# Patient Record
Sex: Female | Born: 1975 | Race: Black or African American | Hispanic: No | Marital: Single | Smoking: Current some day smoker
Health system: Southern US, Community
[De-identification: ages and names within clinical notes are randomized; demographics above are authoritative.]

## PROBLEM LIST (undated history)

## (undated) ENCOUNTER — Emergency Department (HOSPITAL_BASED_OUTPATIENT_CLINIC_OR_DEPARTMENT_OTHER): Admission: EM | Payer: BLUE CROSS/BLUE SHIELD | Source: Home / Self Care

## (undated) DIAGNOSIS — E119 Type 2 diabetes mellitus without complications: Secondary | ICD-10-CM

## (undated) DIAGNOSIS — E7800 Pure hypercholesterolemia, unspecified: Secondary | ICD-10-CM

## (undated) HISTORY — PX: EYE SURGERY: SHX253

## (undated) HISTORY — PX: ECTOPIC PREGNANCY SURGERY: SHX613

## (undated) HISTORY — PX: TUBAL LIGATION: SHX77

---

## 2013-01-06 ENCOUNTER — Emergency Department (HOSPITAL_BASED_OUTPATIENT_CLINIC_OR_DEPARTMENT_OTHER)
Admission: EM | Admit: 2013-01-06 | Discharge: 2013-01-06 | Disposition: A | Discharge status: Home / Self Care | Payer: Medicaid Other | Attending: Emergency Medicine | Admitting: Emergency Medicine

## 2013-01-06 ENCOUNTER — Encounter (HOSPITAL_BASED_OUTPATIENT_CLINIC_OR_DEPARTMENT_OTHER): Payer: Self-pay | Admitting: *Deleted

## 2013-01-06 DIAGNOSIS — R5383 Other fatigue: Secondary | ICD-10-CM | POA: Insufficient documentation

## 2013-01-06 DIAGNOSIS — R42 Dizziness and giddiness: Secondary | ICD-10-CM

## 2013-01-06 DIAGNOSIS — Z3202 Encounter for pregnancy test, result negative: Secondary | ICD-10-CM | POA: Insufficient documentation

## 2013-01-06 DIAGNOSIS — Z79899 Other long term (current) drug therapy: Secondary | ICD-10-CM | POA: Insufficient documentation

## 2013-01-06 DIAGNOSIS — R5381 Other malaise: Secondary | ICD-10-CM | POA: Insufficient documentation

## 2013-01-06 DIAGNOSIS — Z794 Long term (current) use of insulin: Secondary | ICD-10-CM | POA: Insufficient documentation

## 2013-01-06 DIAGNOSIS — E119 Type 2 diabetes mellitus without complications: Secondary | ICD-10-CM | POA: Insufficient documentation

## 2013-01-06 DIAGNOSIS — R112 Nausea with vomiting, unspecified: Secondary | ICD-10-CM | POA: Insufficient documentation

## 2013-01-06 HISTORY — DX: Type 2 diabetes mellitus without complications: E11.9

## 2013-01-06 LAB — CBC WITH DIFFERENTIAL/PLATELET
Basophils Absolute: 0 10*3/uL (ref 0.0–0.1)
Basophils Relative: 0 % (ref 0–1)
Eosinophils Absolute: 0.1 10*3/uL (ref 0.0–0.7)
HCT: 32.9 % — ABNORMAL LOW (ref 36.0–46.0)
Hemoglobin: 11.1 g/dL — ABNORMAL LOW (ref 12.0–15.0)
MCV: 89.2 fL (ref 78.0–100.0)
Neutro Abs: 3.3 10*3/uL (ref 1.7–7.7)
RBC: 3.69 MIL/uL — ABNORMAL LOW (ref 3.87–5.11)
RDW: 14.1 % (ref 11.5–15.5)
WBC: 5.9 10*3/uL (ref 4.0–10.5)

## 2013-01-06 LAB — URINE MICROSCOPIC-ADD ON

## 2013-01-06 LAB — COMPREHENSIVE METABOLIC PANEL
ALT: 9 U/L (ref 0–35)
AST: 11 U/L (ref 0–37)
Albumin: 3.4 g/dL — ABNORMAL LOW (ref 3.5–5.2)
Alkaline Phosphatase: 86 U/L (ref 39–117)
Chloride: 101 mEq/L (ref 96–112)
Potassium: 4.1 mEq/L (ref 3.5–5.1)
Sodium: 137 mEq/L (ref 135–145)
Total Bilirubin: 0.2 mg/dL — ABNORMAL LOW (ref 0.3–1.2)
Total Protein: 7.5 g/dL (ref 6.0–8.3)

## 2013-01-06 LAB — KETONES, QUALITATIVE: Acetone, Bld: NEGATIVE

## 2013-01-06 LAB — POCT I-STAT 3, VENOUS BLOOD GAS (G3P V)
TCO2: 28 mmol/L (ref 0–100)
pCO2, Ven: 40.6 mmHg — ABNORMAL LOW (ref 45.0–50.0)
pH, Ven: 7.428 — ABNORMAL HIGH (ref 7.250–7.300)
pO2, Ven: 43 mmHg (ref 30.0–45.0)

## 2013-01-06 LAB — URINALYSIS, ROUTINE W REFLEX MICROSCOPIC
Glucose, UA: 250 mg/dL — AB
Protein, ur: NEGATIVE mg/dL

## 2013-01-06 MED ORDER — ONDANSETRON HCL 4 MG/2ML IJ SOLN
4.0000 mg | Freq: Once | INTRAMUSCULAR | Status: AC
  Administered 2013-01-06: 4 mg via INTRAVENOUS
  Filled 2013-01-06: qty 2

## 2013-01-06 MED ORDER — SODIUM CHLORIDE 0.9 % IV BOLUS (SEPSIS)
1000.0000 mL | Freq: Once | INTRAVENOUS | Status: AC
  Administered 2013-01-06: 1000 mL via INTRAVENOUS

## 2013-01-06 NOTE — ED Provider Notes (Signed)
History     CSN: [id number]  Arrival date & time 01/06/13  0940   First MD Initiated Contact with Patient 01/06/13 [phone]      Chief Complaint  Patient presents with  . Dizziness    (Consider location/radiation/quality/duration/timing/severity/associated sxs/prior treatment) HPI Comments: Patient is a diabetic who presents with positional dizziness, nausea and vomiting since this morning. She states she felt well yesterday and ate and drank normally. When she got to work today she felt "swimmy headed" with nausea and 2 episodes of vomiting. Denies abdominal pain or diarrhea. No fever. States her sugars have been well controlled. Denies any fevers, chills, cough congestion recent illness. Denies any chest pain or shortness of breath. No leg pain or swelling. She had an ectopic pregnancy 6 months ago with fallopian tube removal.  The history is provided by the patient.    Past Medical History  Diagnosis Date  . Diabetes mellitus without complication     History reviewed. No pertinent past surgical history.  History reviewed. No pertinent family history.  History  Substance Use Topics  . Smoking status: Not on file  . Smokeless tobacco: Not on file  . Alcohol Use: Not on file    OB History   Grav Para Term Preterm Abortions TAB SAB Ect Mult Living                  Review of Systems  Constitutional: Positive for activity change and fatigue. Negative for fever.  HENT: Negative for congestion and rhinorrhea.   Eyes: Negative for visual disturbance.  Respiratory: Negative for cough, chest tightness and shortness of breath.   Cardiovascular: Negative for chest pain.  Gastrointestinal: Positive for nausea and vomiting. Negative for abdominal pain and diarrhea.  Genitourinary: Negative for hematuria, vaginal bleeding and vaginal discharge.  Musculoskeletal: Negative for back pain.  Skin: Negative for rash.  Neurological: Positive for dizziness, weakness and light-headedness.  Negative for headaches.  A complete 10 system review of systems was obtained and all systems are negative except as noted in the HPI and PMH.    Allergies  Oxycontin  Home Medications   Current Outpatient Rx  Name  Route  Sig  Dispense  Refill  . glipiZIDE (GLUCOTROL) 10 MG tablet   Oral   Take 10 mg by mouth 2 (two) times daily before a meal.         . insulin lispro protamine-lispro (HUMALOG 75/25) (75-25) 100 UNIT/ML SUSP   Subcutaneous   Inject 15 Units into the skin daily with breakfast.         . metFORMIN (GLUCOPHAGE) 500 MG tablet   Oral   Take 500 mg by mouth 2 (two) times daily with a meal.         . simvastatin (ZOCOR) 20 MG tablet   Oral   Take 20 mg by mouth every evening.           BP 114/70  Pulse 74  Temp(Src) 98.2 F (36.8 C) (Oral)  Resp 20  Ht 5\' 4"  (1.626 m)  Wt 165 lb (74.844 kg)  BMI 28.31 kg/m2  SpO2 100%  LMP 01/04/2013  Physical Exam  Constitutional: She is oriented to person, place, and time. She appears well-developed and well-nourished. No distress.  HENT:  Head: Normocephalic and atraumatic.  Mouth/Throat: Oropharynx is clear and moist. No oropharyngeal exudate.  Eyes: Conjunctivae and EOM are normal. Pupils are equal, round, and reactive to light.  Neck: Normal range of motion. Neck supple.  Cardiovascular: Normal rate, regular rhythm and normal heart sounds.   No murmur heard. Pulmonary/Chest: Effort normal and breath sounds normal. No respiratory distress.  Abdominal: Soft. There is no tenderness. There is no rebound and no guarding.  Musculoskeletal: Normal range of motion. She exhibits no edema and no tenderness.  Neurological: She is alert and oriented to person, place, and time. No cranial nerve deficit. She exhibits normal muscle tone. Coordination normal.  CN 2-12 intact, no ataxia on finger to nose, no nystagmus, 5/5 strength throughout, no pronator drift, Romberg negative, normal gait.   Skin: Skin is warm.     ED Course  Procedures (including critical care time)  Labs Reviewed  GLUCOSE, CAPILLARY - Abnormal; Notable for the following:    Glucose-Capillary 269 (*)    All other components within normal limits  URINALYSIS, ROUTINE W REFLEX MICROSCOPIC - Abnormal; Notable for the following:    APPearance CLOUDY (*)    Glucose, UA 250 (*)    Hgb urine dipstick MODERATE (*)    All other components within normal limits  CBC WITH DIFFERENTIAL - Abnormal; Notable for the following:    RBC 3.69 (*)    Hemoglobin 11.1 (*)    HCT 32.9 (*)    All other components within normal limits  COMPREHENSIVE METABOLIC PANEL - Abnormal; Notable for the following:    Glucose, Bld 296 (*)    Albumin 3.4 (*)    Total Bilirubin 0.2 (*)    All other components within normal limits  URINE MICROSCOPIC-ADD ON - Abnormal; Notable for the following:    Bacteria, UA FEW (*)    All other components within normal limits  POCT I-STAT 3, BLOOD GAS (G3P V) - Abnormal; Notable for the following:    pH, Ven 7.428 (*)    pCO2, Ven 40.6 (*)    Bicarbonate 26.8 (*)    All other components within normal limits  KETONES, QUALITATIVE  PREGNANCY, URINE  D-DIMER, QUANTITATIVE   No results found.   1. Dizziness       MDM  Positional lightheadedness associated with nausea and vomiting that started this morning. No headache, fever, chest pain or shortness of breath. No focal neurodeficits.  Patient is a diabetic. She is hyperglycemic here with no evidence of DKA.  She's given IV fluids, antiemetics. EKG shows normal sinus rhythm. Electrolytes unremarkable.  UA negative.  Symptomatic improvement after above therapy.  Tolerating PO.  Ambulatory without assistance.  BP improved to 130s systolic.      Date: 01/06/2013  Rate: 90  Rhythm: normal sinus rhythm  QRS Axis: normal  Intervals: normal  ST/T Wave abnormalities: normal  Conduction Disutrbances:none  Narrative Interpretation:   Old EKG Reviewed: none  available    Glynn Octave, MD 01/06/13 [phone]

## 2013-01-06 NOTE — ED Notes (Signed)
Pt amb to room 9 with quick steady gait in nad. Pt reports feeling dizzy upon her arrival at work this am, worsening throughout the morning. Pt called her boss who brought her to ed for eval.

## 2013-01-06 NOTE — ED Notes (Signed)
Pt calls rn to room, c/o iv site "feels burning". Fluid bolus slowed, no redness, swelling or site tenderness noted. Pt given call light and instructed to call if any further concerns.

## 2013-01-09 LAB — GLUCOSE, CAPILLARY: Glucose-Capillary: 176 mg/dL — ABNORMAL HIGH (ref 70–99)

## 2015-03-29 ENCOUNTER — Encounter (HOSPITAL_BASED_OUTPATIENT_CLINIC_OR_DEPARTMENT_OTHER): Payer: Self-pay

## 2015-03-29 ENCOUNTER — Emergency Department (HOSPITAL_BASED_OUTPATIENT_CLINIC_OR_DEPARTMENT_OTHER)
Admission: EM | Admit: 2015-03-29 | Discharge: 2015-03-29 | Disposition: A | Discharge status: Home / Self Care | Payer: No Typology Code available for payment source | Attending: Emergency Medicine | Admitting: Emergency Medicine

## 2015-03-29 DIAGNOSIS — E119 Type 2 diabetes mellitus without complications: Secondary | ICD-10-CM | POA: Insufficient documentation

## 2015-03-29 DIAGNOSIS — Z3202 Encounter for pregnancy test, result negative: Secondary | ICD-10-CM | POA: Insufficient documentation

## 2015-03-29 DIAGNOSIS — M549 Dorsalgia, unspecified: Secondary | ICD-10-CM | POA: Insufficient documentation

## 2015-03-29 DIAGNOSIS — T383X5A Adverse effect of insulin and oral hypoglycemic [antidiabetic] drugs, initial encounter: Secondary | ICD-10-CM | POA: Insufficient documentation

## 2015-03-29 DIAGNOSIS — E780 Pure hypercholesterolemia: Secondary | ICD-10-CM | POA: Diagnosis not present

## 2015-03-29 DIAGNOSIS — R300 Dysuria: Secondary | ICD-10-CM | POA: Diagnosis not present

## 2015-03-29 DIAGNOSIS — T50905A Adverse effect of unspecified drugs, medicaments and biological substances, initial encounter: Secondary | ICD-10-CM

## 2015-03-29 DIAGNOSIS — R109 Unspecified abdominal pain: Secondary | ICD-10-CM | POA: Insufficient documentation

## 2015-03-29 DIAGNOSIS — R350 Frequency of micturition: Secondary | ICD-10-CM | POA: Insufficient documentation

## 2015-03-29 DIAGNOSIS — Z79899 Other long term (current) drug therapy: Secondary | ICD-10-CM | POA: Insufficient documentation

## 2015-03-29 HISTORY — DX: Pure hypercholesterolemia, unspecified: E78.00

## 2015-03-29 LAB — CBC WITH DIFFERENTIAL/PLATELET
Basophils Absolute: 0 10*3/uL (ref 0.0–0.1)
Basophils Relative: 0 % (ref 0–1)
Eosinophils Absolute: 0.1 10*3/uL (ref 0.0–0.7)
Eosinophils Relative: 1 % (ref 0–5)
HEMATOCRIT: 31.8 % — AB (ref 36.0–46.0)
HEMOGLOBIN: 10.3 g/dL — AB (ref 12.0–15.0)
LYMPHS ABS: 1.8 10*3/uL (ref 0.7–4.0)
LYMPHS PCT: 35 % (ref 12–46)
MCH: 28.8 pg (ref 26.0–34.0)
MCHC: 32.4 g/dL (ref 30.0–36.0)
MCV: 88.8 fL (ref 78.0–100.0)
MONOS PCT: 6 % (ref 3–12)
Monocytes Absolute: 0.3 10*3/uL (ref 0.1–1.0)
NEUTROS ABS: 2.9 10*3/uL (ref 1.7–7.7)
NEUTROS PCT: 58 % (ref 43–77)
Platelets: 322 10*3/uL (ref 150–400)
RBC: 3.58 MIL/uL — ABNORMAL LOW (ref 3.87–5.11)
RDW: 15.2 % (ref 11.5–15.5)
WBC: 5.1 10*3/uL (ref 4.0–10.5)

## 2015-03-29 LAB — WET PREP, GENITAL
Clue Cells Wet Prep HPF POC: NONE SEEN
Trich, Wet Prep: NONE SEEN
YEAST WET PREP: NONE SEEN

## 2015-03-29 LAB — URINALYSIS, ROUTINE W REFLEX MICROSCOPIC
Bilirubin Urine: NEGATIVE
Glucose, UA: >1000 mg/dL — AB
HGB URINE DIPSTICK: NEGATIVE
Ketones, ur: NEGATIVE mg/dL
LEUKOCYTES UA: NEGATIVE
Nitrite: NEGATIVE
PROTEIN: NEGATIVE mg/dL
Specific Gravity, Urine: 1.008 (ref 1.005–1.030)
UROBILINOGEN UA: 0.2 mg/dL (ref 0.0–1.0)
pH: 6 (ref 5.0–8.0)

## 2015-03-29 LAB — COMPREHENSIVE METABOLIC PANEL
ALK PHOS: 91 U/L (ref 38–126)
ALT: 11 U/L — ABNORMAL LOW (ref 14–54)
ANION GAP: 8 (ref 5–15)
AST: 15 U/L (ref 15–41)
Albumin: 3.6 g/dL (ref 3.5–5.0)
BILIRUBIN TOTAL: 0.4 mg/dL (ref 0.3–1.2)
BUN: 10 mg/dL (ref 6–20)
CALCIUM: 9.3 mg/dL (ref 8.9–10.3)
CO2: 28 mmol/L (ref 22–32)
CREATININE: 0.66 mg/dL (ref 0.44–1.00)
Chloride: 100 mmol/L — ABNORMAL LOW (ref 101–111)
GFR calc non Af Amer: >60 mL/min (ref >60)
GLUCOSE: 248 mg/dL — AB (ref 65–99)
Potassium: 3.6 mmol/L (ref 3.5–5.1)
Sodium: 136 mmol/L (ref 135–145)
TOTAL PROTEIN: 7.6 g/dL (ref 6.5–8.1)

## 2015-03-29 LAB — URINE MICROSCOPIC-ADD ON

## 2015-03-29 LAB — LIPASE, BLOOD: Lipase: 32 U/L (ref 22–51)

## 2015-03-29 LAB — PREGNANCY, URINE: Preg Test, Ur: NEGATIVE

## 2015-03-29 NOTE — ED Provider Notes (Signed)
CSN: [id number]     Arrival date & time 03/29/15  1115 History   First MD Initiated Contact with Patient 03/29/15 1146     Chief Complaint  Patient presents with  . Back Pain     HPI  Patient presents for evaluation of back pain and a full uncontrolled feeling in her chest as well as urinary frequency and "my vagina doesn't feel right". Just started on a new medication Bydureon.  Took a dose per week for 2 weeks and then was off for one week before restarting one week ago. Symptoms started with restarting the medication.  Eyes vaginal discharge. No shortness of breath or chest pain no nausea vomiting diarrhea. Urinary frequency without dysuria or flank pain.  Past Medical History  Diagnosis Date  . Diabetes mellitus without complication   . High cholesterol    Past Surgical History  Procedure Laterality Date  . Ectopic pregnancy surgery    . Tubal ligation     No family history on file. Social History  Substance Use Topics  . Smoking status: Never Smoker   . Smokeless tobacco: None  . Alcohol Use: No   OB History    No data available     Review of Systems  Constitutional: Negative for fever, chills, diaphoresis, appetite change and fatigue.  HENT: Negative for mouth sores, sore throat and trouble swallowing.   Eyes: Negative for visual disturbance.  Respiratory: Negative for cough, chest tightness, shortness of breath and wheezing.   Cardiovascular: Negative for chest pain.  Gastrointestinal: Positive for abdominal pain and abdominal distention. Negative for nausea, vomiting and diarrhea.  Endocrine: Negative for polydipsia, polyphagia and polyuria.  Genitourinary: Positive for dysuria and frequency. Negative for hematuria.  Musculoskeletal: Negative for gait problem.  Skin: Negative for color change, pallor and rash.  Neurological: Negative for dizziness, syncope, light-headedness and headaches.  Hematological: Does not bruise/bleed easily.  Psychiatric/Behavioral:  Negative for behavioral problems and confusion.      Allergies  Etodolac and Oxycontin  Home Medications   Prior to Admission medications   Medication Sig Start Date End Date Taking? Authorizing Provider  Exenatide (BYDUREON [place]) Inject into the skin.   Yes Historical Provider, MD  Insulin Aspart (NOVOLOG [place]) Inject into the skin.   Yes Historical Provider, MD  pravastatin (PRAVACHOL) 20 MG tablet Take 20 mg by mouth daily.   Yes Historical Provider, MD  glipiZIDE (GLUCOTROL) 10 MG tablet Take 10 mg by mouth 2 (two) times daily before a meal.    Historical Provider, MD  metFORMIN (GLUCOPHAGE) 500 MG tablet Take 500 mg by mouth 2 (two) times daily with a meal.    Historical Provider, MD   BP 136/86 mmHg  Pulse 81  Temp(Src) 98.6 F (37 C) (Oral)  Resp 18  Ht  (1.651 m)  Wt 180 lb (81.647 kg)  BMI 29.95 kg/m2  SpO2 98%  LMP 03/21/2015 Physical Exam  Constitutional: She is oriented to person, place, and time. She appears well-developed and well-nourished. No distress.  HENT:  Head: Normocephalic.  Eyes: Conjunctivae are normal. Pupils are equal, round, and reactive to light. No scleral icterus.  Neck: Normal range of motion. Neck supple. No thyromegaly present.  Cardiovascular: Normal rate and regular rhythm.  Exam reveals no gallop and no friction rub.   No murmur heard. Pulmonary/Chest: Effort normal and breath sounds normal. No respiratory distress. She has no wheezes. She has no rales.  Abdominal: Soft. Bowel sounds are normal. She exhibits no distension.  There is no tenderness. There is no rebound.    Genitourinary:    Musculoskeletal: Normal range of motion.  Neurological: She is alert and oriented to person, place, and time.  Skin: Skin is warm and dry. No rash noted.  Psychiatric: She has a normal mood and affect. Her behavior is normal.    ED Course  Procedures (including critical care time) Labs Review Labs Reviewed  WET PREP, GENITAL - Abnormal;  Notable for the following:    WBC, Wet Prep HPF POC FEW (*)    All other components within normal limits  URINALYSIS, ROUTINE W REFLEX MICROSCOPIC (NOT AT [facility]) - Abnormal; Notable for the following:    Glucose, UA >1000 (*)    All other components within normal limits  CBC WITH DIFFERENTIAL/PLATELET - Abnormal; Notable for the following:    RBC 3.58 (*)    Hemoglobin 10.3 (*)    HCT 31.8 (*)    All other components within normal limits  COMPREHENSIVE METABOLIC PANEL - Abnormal; Notable for the following:    Chloride 100 (*)    Glucose, Bld 248 (*)    ALT 11 (*)    All other components within normal limits  PREGNANCY, URINE  URINE MICROSCOPIC-ADD ON  LIPASE, BLOOD  GC/CHLAMYDIA PROBE AMP ([place]) NOT AT [facility]    Imaging Review No results found. I have personally reviewed and evaluated these images and lab results as part of my medical decision-making.   EKG Interpretation None      MDM   Final diagnoses:  Medication side effects, initial encounter    Many of her symptoms could be related for diabetes or new medication. The medication does list the leg gastric emptying as a side effect. Vascular 2 small frequent meals and this may help her limit her sensation of abdominal bloating. No signs of chemical hepatitis or pancreatitis. She has large uterine glucose. This likely is leading to her dysuria and frequency. No sign of renal insufficiency or acute urinary tract infection. Has marked vaginal dryness on exam but no sign of infection. Likely again secondary to her urinary frequency and dehydration. Asked to use simple vaginal lubricants and to follow-up with her primary care physician.   Rolland Porter, MD 03/29/15 [phone]

## 2015-03-29 NOTE — Discharge Instructions (Signed)
Small frequent meals.  Over-the-counter vaginal lubricant as needed.  Follow-up with your physician regarding further advice about continuing or discontinuing the new medication.

## 2015-03-29 NOTE — ED Notes (Signed)
C/o lower back pain x 2-3 weeks-"uncomfortable" when urinates but denies dysuria-concerned could have kidney issues r/t new DM med "bydureon"

## 2015-04-01 LAB — GC/CHLAMYDIA PROBE AMP (~~LOC~~) NOT AT ARMC
Chlamydia: NEGATIVE
Neisseria Gonorrhea: NEGATIVE

## 2015-04-06 ENCOUNTER — Emergency Department (HOSPITAL_BASED_OUTPATIENT_CLINIC_OR_DEPARTMENT_OTHER)
Admission: EM | Admit: 2015-04-06 | Discharge: 2015-04-06 | Disposition: A | Discharge status: Home / Self Care | Payer: No Typology Code available for payment source | Attending: Physician Assistant | Admitting: Physician Assistant

## 2015-04-06 ENCOUNTER — Emergency Department (HOSPITAL_BASED_OUTPATIENT_CLINIC_OR_DEPARTMENT_OTHER): Payer: No Typology Code available for payment source

## 2015-04-06 ENCOUNTER — Encounter (HOSPITAL_BASED_OUTPATIENT_CLINIC_OR_DEPARTMENT_OTHER): Payer: Self-pay | Admitting: *Deleted

## 2015-04-06 DIAGNOSIS — R198 Other specified symptoms and signs involving the digestive system and abdomen: Secondary | ICD-10-CM

## 2015-04-06 DIAGNOSIS — K5900 Constipation, unspecified: Secondary | ICD-10-CM | POA: Insufficient documentation

## 2015-04-06 DIAGNOSIS — Z79899 Other long term (current) drug therapy: Secondary | ICD-10-CM | POA: Insufficient documentation

## 2015-04-06 DIAGNOSIS — Z3202 Encounter for pregnancy test, result negative: Secondary | ICD-10-CM | POA: Diagnosis not present

## 2015-04-06 DIAGNOSIS — M545 Low back pain: Secondary | ICD-10-CM | POA: Diagnosis present

## 2015-04-06 DIAGNOSIS — R739 Hyperglycemia, unspecified: Secondary | ICD-10-CM

## 2015-04-06 DIAGNOSIS — E1165 Type 2 diabetes mellitus with hyperglycemia: Secondary | ICD-10-CM | POA: Diagnosis not present

## 2015-04-06 DIAGNOSIS — Z794 Long term (current) use of insulin: Secondary | ICD-10-CM | POA: Diagnosis not present

## 2015-04-06 DIAGNOSIS — E780 Pure hypercholesterolemia: Secondary | ICD-10-CM | POA: Diagnosis not present

## 2015-04-06 LAB — URINALYSIS, ROUTINE W REFLEX MICROSCOPIC
Bilirubin Urine: NEGATIVE
GLUCOSE, UA: 500 mg/dL — AB
Hgb urine dipstick: NEGATIVE
KETONES UR: NEGATIVE mg/dL
LEUKOCYTES UA: NEGATIVE
NITRITE: NEGATIVE
PH: 5.5 (ref 5.0–8.0)
Protein, ur: NEGATIVE mg/dL
SPECIFIC GRAVITY, URINE: 1.004 — AB (ref 1.005–1.030)
Urobilinogen, UA: 0.2 mg/dL (ref 0.0–1.0)

## 2015-04-06 LAB — CBC WITH DIFFERENTIAL/PLATELET
BASOS ABS: 0 10*3/uL (ref 0.0–0.1)
Basophils Relative: 0 % (ref 0–1)
Eosinophils Absolute: 0.1 10*3/uL (ref 0.0–0.7)
Eosinophils Relative: 1 % (ref 0–5)
HEMATOCRIT: 35.8 % — AB (ref 36.0–46.0)
Hemoglobin: 11.4 g/dL — ABNORMAL LOW (ref 12.0–15.0)
LYMPHS ABS: 2.6 10*3/uL (ref 0.7–4.0)
LYMPHS PCT: 43 % (ref 12–46)
MCH: 28.4 pg (ref 26.0–34.0)
MCHC: 31.8 g/dL (ref 30.0–36.0)
MCV: 89.3 fL (ref 78.0–100.0)
Monocytes Absolute: 0.4 10*3/uL (ref 0.1–1.0)
Monocytes Relative: 6 % (ref 3–12)
NEUTROS ABS: 3.1 10*3/uL (ref 1.7–7.7)
Neutrophils Relative %: 50 % (ref 43–77)
Platelets: 360 10*3/uL (ref 150–400)
RBC: 4.01 MIL/uL (ref 3.87–5.11)
RDW: 15.2 % (ref 11.5–15.5)
WBC: 6.1 10*3/uL (ref 4.0–10.5)

## 2015-04-06 LAB — COMPREHENSIVE METABOLIC PANEL
ALK PHOS: 80 U/L (ref 38–126)
ALT: 13 U/L — AB (ref 14–54)
AST: 15 U/L (ref 15–41)
Albumin: 3.7 g/dL (ref 3.5–5.0)
Anion gap: 9 (ref 5–15)
BILIRUBIN TOTAL: 0.3 mg/dL (ref 0.3–1.2)
BUN: 9 mg/dL (ref 6–20)
CALCIUM: 9.3 mg/dL (ref 8.9–10.3)
CHLORIDE: 101 mmol/L (ref 101–111)
CO2: 29 mmol/L (ref 22–32)
CREATININE: 0.91 mg/dL (ref 0.44–1.00)
Glucose, Bld: 112 mg/dL — ABNORMAL HIGH (ref 65–99)
Potassium: 3.4 mmol/L — ABNORMAL LOW (ref 3.5–5.1)
Sodium: 139 mmol/L (ref 135–145)
TOTAL PROTEIN: 7.7 g/dL (ref 6.5–8.1)

## 2015-04-06 LAB — WET PREP, GENITAL
Trich, Wet Prep: NONE SEEN
YEAST WET PREP: NONE SEEN

## 2015-04-06 LAB — CBG MONITORING, ED: GLUCOSE-CAPILLARY: 249 mg/dL — AB (ref 65–99)

## 2015-04-06 LAB — LIPASE, BLOOD: LIPASE: 32 U/L (ref 22–51)

## 2015-04-06 LAB — PREGNANCY, URINE: Preg Test, Ur: NEGATIVE

## 2015-04-06 MED ORDER — IOHEXOL 300 MG/ML  SOLN
100.0000 mL | Freq: Once | INTRAMUSCULAR | Status: AC | PRN
  Administered 2015-04-06: 100 mL via INTRAVENOUS

## 2015-04-06 MED ORDER — IOHEXOL 300 MG/ML  SOLN
25.0000 mL | Freq: Once | INTRAMUSCULAR | Status: AC | PRN
  Administered 2015-04-06: 25 mL via ORAL

## 2015-04-06 MED ORDER — MORPHINE SULFATE (PF) 4 MG/ML IV SOLN
2.0000 mg | Freq: Once | INTRAVENOUS | Status: AC
  Administered 2015-04-06: 2 mg via INTRAVENOUS
  Filled 2015-04-06: qty 1

## 2015-04-06 MED ORDER — POLYETHYLENE GLYCOL 3350 17 G PO PACK: 17.0000 g | PACK | Freq: Two times a day (BID) | ORAL | Status: DC

## 2015-04-06 NOTE — ED Provider Notes (Signed)
CSN: [id number]     Arrival date & time 04/06/15  1355 History   First MD Initiated Contact with Patient 04/06/15 1629     Chief Complaint  Patient presents with  . Back Pain  . Hematuria     (Consider location/radiation/quality/duration/timing/severity/associated sxs/prior Treatment) The history is provided by the patient and medical records. No language interpreter was used.     Shawna Coleman is a 39 y.o. female  with a hx of IDDM presents to the Emergency Department complaining of gradual, persistent, progressively worsening back pain, abd pain and fullness, hematuria onset 03/22/15. Associated symptoms include incomplete emptying of her bladder.  Pt reports that she was seen last week for the same and was taken of her [place] without relief of the symptoms.  Pt reports hematuria yesterday when wiping but none today.  Pt denies vaginal discharge.  No aggravating or alleviating factors.  Pt denies fever, chills, headache, neck pain, chest pain, SOB, nausea, vomiting, diarrhea, weakness, dizziness, syncope, dysuria.  Pt denies hx of kidney stoneLMP: 03/21/15  Record review shows that pt was evaluated on 03/29/15 for the same symptoms.      Past Medical History  Diagnosis Date  . Diabetes mellitus without complication   . High cholesterol    Past Surgical History  Procedure Laterality Date  . Ectopic pregnancy surgery    . Tubal ligation     No family history on file. Social History  Substance Use Topics  . Smoking status: Never Smoker   . Smokeless tobacco: None  . Alcohol Use: No   OB History    No data available     Review of Systems  Constitutional: Negative for fever, diaphoresis, appetite change, fatigue and unexpected weight change.  HENT: Negative for mouth sores.   Eyes: Negative for visual disturbance.  Respiratory: Negative for cough, chest tightness, shortness of breath and wheezing.   Cardiovascular: Negative for chest pain.  Gastrointestinal: Positive for  abdominal pain. Negative for nausea, vomiting, diarrhea and constipation.       Abdominal fullness  Endocrine: Negative for polydipsia, polyphagia and polyuria.  Genitourinary: Positive for hematuria. Negative for dysuria, urgency and frequency.       Incomplete bladder emptying  Musculoskeletal: Positive for back pain (bilateral lumbar and thoracic). Negative for neck stiffness.  Skin: Negative for rash.  Allergic/Immunologic: Negative for immunocompromised state.  Neurological: Negative for syncope, light-headedness and headaches.  Hematological: Does not bruise/bleed easily.  Psychiatric/Behavioral: Negative for sleep disturbance. The patient is not nervous/anxious.       Allergies  Etodolac and Oxycontin  Home Medications   Prior to Admission medications   Medication Sig Start Date End Date Taking? Authorizing Provider  Exenatide (BYDUREON [place]) Inject into the skin.   Yes Historical Provider, MD  glipiZIDE (GLUCOTROL) 10 MG tablet Take 10 mg by mouth 2 (two) times daily before a meal.   Yes Historical Provider, MD  Insulin Aspart (NOVOLOG [place]) Inject into the skin.   Yes Historical Provider, MD  metFORMIN (GLUCOPHAGE) 500 MG tablet Take 500 mg by mouth 2 (two) times daily with a meal.   Yes Historical Provider, MD  pantoprazole (PROTONIX) 20 MG tablet Take 40 mg by mouth daily.   Yes Historical Provider, MD  pravastatin (PRAVACHOL) 20 MG tablet Take 20 mg by mouth daily.   Yes Historical Provider, MD  polyethylene glycol (MIRALAX / GLYCOLAX) packet Take 17 g by mouth 2 (two) times daily. 04/06/15   Karilyn Wind, PA-C   BP 139/80  mmHg  Pulse 78  Temp(Src) 98.2 F (36.8 C) (Oral)  Resp 16  Ht 5\' 4"  (1.626 m)  Wt 177 lb (80.287 kg)  BMI 30.37 kg/m2  SpO2 100%  LMP 03/21/2015 Physical Exam  Constitutional: She appears well-developed and well-nourished. No distress.  HENT:  Head: Normocephalic and atraumatic.  Mouth/Throat: Oropharynx is clear and moist. No  oropharyngeal exudate.  Eyes: Conjunctivae are normal.  Neck: Normal range of motion. Neck supple.  Full ROM without pain  Cardiovascular: Normal rate, regular rhythm, normal heart sounds and intact distal pulses.   No murmur heard. Pulmonary/Chest: Effort normal and breath sounds normal. No respiratory distress. She has no wheezes.  Abdominal: Soft. Bowel sounds are normal. She exhibits no distension and no mass. There is no tenderness. There is no rebound and no guarding. CVA tenderness: minimal and bilateral. Hernia confirmed negative in the right inguinal area and confirmed negative in the left inguinal area.  Soft and nontender  Genitourinary: Uterus normal. No labial fusion. There is no rash, tenderness or lesion on the right labia. There is no rash, tenderness or lesion on the left labia. Uterus is not deviated, not enlarged, not fixed and not tender. Cervix exhibits no motion tenderness, no discharge and no friability. Right adnexum displays no mass, no tenderness and no fullness. Left adnexum displays no mass, no tenderness and no fullness. No erythema, tenderness or bleeding in the vagina. No foreign body around the vagina. No signs of injury around the vagina. No vaginal discharge found.  Fleck of blood noted at the cervical os  Musculoskeletal: Normal range of motion. She exhibits no edema.  Full range of motion of the T-spine and L-spine No tenderness to palpation of the spinous processes of the T-spine or L-spine Mild tenderness to palpation of the paraspinous muscles of the lower T spine and L-spine  Lymphadenopathy:    She has no cervical adenopathy.       Right: No inguinal adenopathy present.       Left: No inguinal adenopathy present.  Neurological: She is alert. She has normal reflexes.  Reflex Scores:      Bicep reflexes are 2+ on the right side and 2+ on the left side.      Brachioradialis reflexes are 2+ on the right side and 2+ on the left side.      Patellar reflexes  are 2+ on the right side and 2+ on the left side.      Achilles reflexes are 2+ on the right side and 2+ on the left side. Speech is clear and goal oriented, follows commands Normal 5/5 strength in upper and lower extremities bilaterally including dorsiflexion and plantar flexion, strong and equal grip strength Sensation normal to light and sharp touch Moves extremities without ataxia, coordination intact Normal gait Normal balance No Clonus   Skin: Skin is warm and dry. No rash noted. She is not diaphoretic. No erythema.  Psychiatric: She has a normal mood and affect. Her behavior is normal.  Nursing note and vitals reviewed.   ED Course  Procedures (including critical care time) Labs Review Labs Reviewed  WET PREP, GENITAL - Abnormal; Notable for the following:    Clue Cells Wet Prep HPF POC MODERATE (*)    WBC, Wet Prep HPF POC FEW (*)    All other components within normal limits  URINALYSIS, ROUTINE W REFLEX MICROSCOPIC (NOT AT [facility]) - Abnormal; Notable for the following:    Specific Gravity, Urine 1.004 (*)  Glucose, UA 500 (*)    All other components within normal limits  CBC WITH DIFFERENTIAL/PLATELET - Abnormal; Notable for the following:    Hemoglobin 11.4 (*)    HCT 35.8 (*)    All other components within normal limits  COMPREHENSIVE METABOLIC PANEL - Abnormal; Notable for the following:    Potassium 3.4 (*)    Glucose, Bld 112 (*)    ALT 13 (*)    All other components within normal limits  CBG MONITORING, ED - Abnormal; Notable for the following:    Glucose-Capillary 249 (*)    All other components within normal limits  PREGNANCY, URINE  LIPASE, BLOOD    Imaging Review Ct Abdomen Pelvis W Contrast  04/06/2015   CLINICAL DATA:  Abdominal pain, back pain, bilateral flank pain. Constipation.  EXAM: CT ABDOMEN AND PELVIS WITH CONTRAST  TECHNIQUE: Multidetector CT imaging of the abdomen and pelvis was performed using the standard protocol following bolus  administration of intravenous contrast.  CONTRAST:  OMNIPAQUE IOHEXOL 300 MG/ML SOLN, 25mL OMNIPAQUE IOHEXOL 300 MG/ML SOLN  COMPARISON:  None.  FINDINGS: Lung bases are clear. No effusions. Heart is normal size.  Liver, spleen, pancreas, adrenals, kidneys, gallbladder unremarkable.  Appendix is visualized and is normal. Uterus, adnexae and urinary bladder unremarkable. Stomach, large and small bowel normal. Moderate stool in the colon. No free fluid, free air or adenopathy. Aorta is normal caliber.  No acute bony abnormality or focal lesion.  IMPRESSION: Moderate stool burden throughout the colon.  Normal appendix.  No acute findings in the abdomen or pelvis.   Electronically Signed   By: Charlett Nose M.D.   On: 04/06/2015 18:21   I have personally reviewed and evaluated these images and lab results as part of my medical decision-making.   EKG Interpretation None      MDM   Final diagnoses:  Abdominal fullness  Constipation, unspecified constipation type  Hyperglycemia   Shawna Coleman presents with persistent symptoms of dysuria, abd and bladder fullness, lack of appetite and intermittent hematuria.  Record review shows the patient was evaluated for this on 03/29/2015 without significant findings and lab work on pelvic exam. Will obtain CT scan as patient continues to feel uncomfortable.  Labs are reassuring. Pelvic exam without vaginal discharge. No cervical motion tenderness. Patient with elevated glucose to 249 but no anion gap. Urinalysis without evidence of urinary tract infection. Patient's hematuria is actually blood from the vaginal vault. Patient is without a surgical abdomen. No peritoneal signs.  CT scan with moderate stool burden throughout the colon but normal appendix no evidence of kidney stone, diverticulitis or other reasons for her abdominal discomfort and other symptoms. No evidence of pyelonephritis on CT scan and no evidence of UTI.  Patient is taking an iron  supplement. This may be the cause of her significant constipation. No emergent conditions at this time. Recommend she follow-up with primary care, begin MiraLAX and drink plenty of water. Repeat exam her abdomen remained soft and nontender.  BP 139/80 mmHg  Pulse 78  Temp(Src) 98.2 F (36.8 C) (Oral)  Resp 16  Ht  (1.626 m)  Wt 177 lb (80.287 kg)  BMI 30.37 kg/m2  SpO2 100%  LMP 03/21/2015   Dierdre Forth, PA-C 04/06/15 2357  Courteney Lyn Mackuen, MD 04/07/15 1456

## 2015-04-06 NOTE — ED Notes (Addendum)
Back pain, bladder fullness, hematuria x 2 weeks after starting new med- seen here for same on 8/19- states sx have not improved

## 2015-04-06 NOTE — Discharge Instructions (Signed)
1. Medications: miralax, usual home medications 2. Treatment: rest, drink plenty of fluids, advance diet slowly 3. Follow Up: Please followup with your primary doctor in 2 days for discussion of your diagnoses and further evaluation after today's visit; if you do not have a primary care doctor use the resource guide provided to find one; Please return to the ER for persistent vomiting, high fevers or worsening symptoms   Constipation Constipation is when a person has fewer than three bowel movements a week, has difficulty having a bowel movement, or has stools that are dry, hard, or larger than normal. As people grow older, constipation is more common. If you try to fix constipation with medicines that make you have a bowel movement (laxatives), the problem may get worse. Long-term laxative use may cause the muscles of the colon to become weak. A low-fiber diet, not taking in enough fluids, and taking certain medicines may make constipation worse.  CAUSES   Certain medicines, such as antidepressants, pain medicine, iron supplements, antacids, and water pills.   Certain diseases, such as diabetes, irritable bowel syndrome (IBS), thyroid disease, or depression.   Not drinking enough water.   Not eating enough fiber-rich foods.   Stress or travel.   Lack of physical activity or exercise.   Ignoring the urge to have a bowel movement.   Using laxatives too much.  SIGNS AND SYMPTOMS   Having fewer than three bowel movements a week.   Straining to have a bowel movement.   Having stools that are hard, dry, or larger than normal.   Feeling full or bloated.   Pain in the lower abdomen.   Not feeling relief after having a bowel movement.  DIAGNOSIS  Your health care provider will take a medical history and perform a physical exam. Further testing may be done for severe constipation. Some tests may include:  A barium enema X-ray to examine your rectum, colon, and, sometimes,  your small intestine.   A sigmoidoscopy to examine your lower colon.   A colonoscopy to examine your entire colon. TREATMENT  Treatment will depend on the severity of your constipation and what is causing it. Some dietary treatments include drinking more fluids and eating more fiber-rich foods. Lifestyle treatments may include regular exercise. If these diet and lifestyle recommendations do not help, your health care provider may recommend taking over-the-counter laxative medicines to help you have bowel movements. Prescription medicines may be prescribed if over-the-counter medicines do not work.  HOME CARE INSTRUCTIONS   Eat foods that have a lot of fiber, such as fruits, vegetables, whole grains, and beans.  Limit foods high in fat and processed sugars, such as french fries, hamburgers, cookies, candies, and soda.   A fiber supplement may be added to your diet if you cannot get enough fiber from foods.   Drink enough fluids to keep your urine clear or pale yellow.   Exercise regularly or as directed by your health care provider.   Go to the restroom when you have the urge to go. Do not hold it.   Only take over-the-counter or prescription medicines as directed by your health care provider. Do not take other medicines for constipation without talking to your health care provider first.  SEEK IMMEDIATE MEDICAL CARE IF:   You have bright red blood in your stool.   Your constipation lasts for more than 4 days or gets worse.   You have abdominal or rectal pain.   You have thin, pencil-like stools.  You have unexplained weight loss. MAKE SURE YOU:   Understand these instructions.  Will watch your condition.  Will get help right away if you are not doing well or get worse. Document Released: 04/24/2004 Document Revised: 08/01/2013 Document Reviewed: 05/08/2013 [facility] Patient Information 2015 [place], [place]. This information is not intended to replace advice given  to you by your health care provider. Make sure you discuss any questions you have with your health care provider.    Bloating Bloating is the feeling of fullness in your belly. You may feel as though your pants are too tight. Often the cause of bloating is overeating, retaining fluids, or having gas in your bowel. It is also caused by swallowing air and eating foods that cause gas. Irritable bowel syndrome is one of the most common causes of bloating. Constipation is also a common cause. Sometimes more serious problems can cause bloating. SYMPTOMS  Usually there is a feeling of fullness, as though your abdomen is bulged out. There may be mild discomfort.  DIAGNOSIS  Usually no particular testing is necessary for most bloating. If the condition persists and seems to become worse, your caregiver may do additional testing.  TREATMENT   There is no direct treatment for bloating.  Do not put gas into the bowel. Avoid chewing gum and sucking on candy. These tend to make you swallow air. Swallowing air can also be a nervous habit. Try to avoid this.  Avoiding high residue diets will help. Eat foods with soluble fibers (examples include root vegetables, apples, or barley) and substitute dairy products with soy and rice products. This helps irritable bowel syndrome.  If constipation is the cause, then a high residue diet with more fiber will help.  Avoid carbonated beverages.  Over-the-counter preparations are available that help reduce gas. Your pharmacist can help you with this. SEEK MEDICAL CARE IF:   Bloating continues and seems to be getting worse.  You notice a weight gain.  You have a weight loss but the bloating is getting worse.  You have changes in your bowel habits or develop nausea or vomiting. SEEK IMMEDIATE MEDICAL CARE IF:   You develop shortness of breath or swelling in your legs.  You have an increase in abdominal pain or develop chest pain. Document Released: 05/27/2006  Document Revised: 10/19/2011 Document Reviewed: 07/15/2007 [facility] Patient Information 2015 [place], [place]. This information is not intended to replace advice given to you by your health care provider. Make sure you discuss any questions you have with your health care provider.

## 2015-04-06 NOTE — ED Notes (Signed)
Pt c/o abd pain, x 2 weeks, has had this pain before. Nausea but no vomiting

## 2015-04-06 NOTE — ED Notes (Addendum)
Patient transported to CT, via stretcher, sr x 2 up 

## 2015-04-08 LAB — GC/CHLAMYDIA PROBE AMP (~~LOC~~) NOT AT ARMC
Chlamydia: NEGATIVE
Neisseria Gonorrhea: NEGATIVE

## 2015-04-08 NOTE — ED Notes (Signed)
[facility] called to request an order for the [facility].  States they have received a specimen but do not have an order.

## 2015-04-29 ENCOUNTER — Emergency Department (HOSPITAL_BASED_OUTPATIENT_CLINIC_OR_DEPARTMENT_OTHER): Payer: 59

## 2015-04-29 ENCOUNTER — Emergency Department (HOSPITAL_BASED_OUTPATIENT_CLINIC_OR_DEPARTMENT_OTHER)
Admission: EM | Admit: 2015-04-29 | Discharge: 2015-04-29 | Disposition: A | Discharge status: Home / Self Care | Payer: 59 | Attending: Emergency Medicine | Admitting: Emergency Medicine

## 2015-04-29 ENCOUNTER — Encounter (HOSPITAL_BASED_OUTPATIENT_CLINIC_OR_DEPARTMENT_OTHER): Payer: Self-pay

## 2015-04-29 DIAGNOSIS — R102 Pelvic and perineal pain: Secondary | ICD-10-CM

## 2015-04-29 DIAGNOSIS — Z79899 Other long term (current) drug therapy: Secondary | ICD-10-CM | POA: Diagnosis not present

## 2015-04-29 DIAGNOSIS — Z3202 Encounter for pregnancy test, result negative: Secondary | ICD-10-CM | POA: Insufficient documentation

## 2015-04-29 DIAGNOSIS — Z794 Long term (current) use of insulin: Secondary | ICD-10-CM | POA: Insufficient documentation

## 2015-04-29 DIAGNOSIS — K5900 Constipation, unspecified: Secondary | ICD-10-CM | POA: Diagnosis not present

## 2015-04-29 DIAGNOSIS — E780 Pure hypercholesterolemia: Secondary | ICD-10-CM | POA: Diagnosis not present

## 2015-04-29 DIAGNOSIS — E119 Type 2 diabetes mellitus without complications: Secondary | ICD-10-CM | POA: Insufficient documentation

## 2015-04-29 DIAGNOSIS — R1032 Left lower quadrant pain: Secondary | ICD-10-CM | POA: Diagnosis present

## 2015-04-29 LAB — URINALYSIS, ROUTINE W REFLEX MICROSCOPIC
BILIRUBIN URINE: NEGATIVE
HGB URINE DIPSTICK: NEGATIVE
KETONES UR: NEGATIVE mg/dL
LEUKOCYTES UA: NEGATIVE
Nitrite: NEGATIVE
PROTEIN: NEGATIVE mg/dL
Specific Gravity, Urine: 1.023 (ref 1.005–1.030)
Urobilinogen, UA: 0.2 mg/dL (ref 0.0–1.0)
pH: 5.5 (ref 5.0–8.0)

## 2015-04-29 LAB — URINE MICROSCOPIC-ADD ON

## 2015-04-29 LAB — WET PREP, GENITAL
CLUE CELLS WET PREP: NONE SEEN
TRICH WET PREP: NONE SEEN
YEAST WET PREP: NONE SEEN

## 2015-04-29 LAB — CBG MONITORING, ED: Glucose-Capillary: 300 mg/dL — ABNORMAL HIGH (ref 65–99)

## 2015-04-29 LAB — PREGNANCY, URINE: PREG TEST UR: NEGATIVE

## 2015-04-29 NOTE — ED Notes (Signed)
LLQ pain started after lunch today

## 2015-04-29 NOTE — ED Provider Notes (Signed)
CSN: [id number]     Arrival date & time 04/29/15  1443 History  This chart was scribed for Shawna Bucco, MD by Lyndel Safe, ED Scribe. This patient was seen in room MH02/MH02 and the patient's care was started 3:28 PM.   Chief Complaint  Patient presents with  . Abdominal Pain   Patient is a 39 y.o. female presenting with abdominal pain. The history is provided by the patient. No language interpreter was used.  Abdominal Pain Associated symptoms: no chest pain, no chills, no cough, no diarrhea, no fatigue, no fever, no hematuria, no nausea, no shortness of breath and no vomiting    HPI Comments: Shawna Coleman is a 39 y.o. female, with a PMhx of DM and HLD, who presents to the Emergency Department complaining of constant, throbbing, LLQ abdominal pain onset 3-4 days ago associated with her menstrual cycle. Pt reports today after lunch, several hours ago, the throbbing LLQ pain began to radiate to her left mid abdomen. Pt additionally reports mild constipation; last normal BM was 1 day ago. She has taken tylenol with mild relief. Pt notes her CBG has been waxing and waning recently with changes in medication and states it has 'slowed down her digestive system.' She is currently taking glipizide, metformin, and Novolog. Her DM is managed by an endocrinologist but pt not followed by PCP. Denies vaginal bleeding or discharge, nausea or vomiting, dysuria, fevers or any recent illness.   Past Medical History  Diagnosis Date  . Diabetes mellitus without complication   . High cholesterol    Past Surgical History  Procedure Laterality Date  . Ectopic pregnancy surgery    . Tubal ligation     No family history on file. Social History  Substance Use Topics  . Smoking status: Never Smoker   . Smokeless tobacco: None  . Alcohol Use: Yes   OB History    No data available     Review of Systems  Constitutional: Negative for fever, chills, diaphoresis and fatigue.  HENT: Negative for  congestion, rhinorrhea and sneezing.   Eyes: Negative.   Respiratory: Negative for cough, chest tightness and shortness of breath.   Cardiovascular: Negative for chest pain and leg swelling.  Gastrointestinal: Positive for abdominal pain. Negative for nausea, vomiting, diarrhea and blood in stool.  Genitourinary: Positive for pelvic pain. Negative for frequency, hematuria, flank pain and difficulty urinating.  Musculoskeletal: Negative for back pain and arthralgias.  Skin: Negative for rash.  Neurological: Negative for dizziness, speech difficulty, weakness, numbness and headaches.   Allergies  Etodolac and Oxycontin  Home Medications   Prior to Admission medications   Medication Sig Start Date End Date Taking? Authorizing Provider  glipiZIDE (GLUCOTROL) 10 MG tablet Take 10 mg by mouth 2 (two) times daily before a meal.    Historical Provider, MD  Insulin Aspart (NOVOLOG [place]) Inject into the skin.    Historical Provider, MD  metFORMIN (GLUCOPHAGE) 500 MG tablet Take 500 mg by mouth 2 (two) times daily with a meal.    Historical Provider, MD  pantoprazole (PROTONIX) 20 MG tablet Take 40 mg by mouth daily.    Historical Provider, MD  pravastatin (PRAVACHOL) 20 MG tablet Take 20 mg by mouth daily.    Historical Provider, MD   BP 140/74 mmHg  Pulse 100  Temp(Src) 98.5 F (36.9 C) (Oral)  Resp 18  Ht  (1.626 m)  Wt 175 lb (79.379 kg)  BMI 30.02 kg/m2  SpO2 98%  LMP 04/19/2015 Physical  Exam  Constitutional: She is oriented to person, place, and time. She appears well-developed and well-nourished.  HENT:  Head: Normocephalic and atraumatic.  Eyes: Pupils are equal, round, and reactive to light.  Neck: Normal range of motion. Neck supple.  Cardiovascular: Normal rate, regular rhythm and normal heart sounds.   Pulmonary/Chest: Effort normal and breath sounds normal. No respiratory distress. She has no wheezes. She has no rales. She exhibits no tenderness.  Abdominal: Soft. Bowel  sounds are normal. There is tenderness (+ moderate tenderness to LLQ, mild tenderness to left mid abdomen). There is no rebound and no guarding.  Genitourinary: No vaginal discharge found.  No significant discharge.  Moderate pain to left adnexa  Musculoskeletal: Normal range of motion. She exhibits no edema.  Lymphadenopathy:    She has no cervical adenopathy.  Neurological: She is alert and oriented to person, place, and time.  Skin: Skin is warm and dry. No rash noted.  Psychiatric: She has a normal mood and affect.    ED Course  Procedures  DIAGNOSTIC STUDIES: Oxygen Saturation is 98% on RA, normal by my interpretation.    COORDINATION OF CARE: 3:35 PM Discussed treatment plan with pt at bedside and pt agreed to plan.   Labs Review Labs Reviewed  WET PREP, GENITAL - Abnormal; Notable for the following:    WBC, Wet Prep HPF POC FEW (*)    All other components within normal limits  URINALYSIS, ROUTINE W REFLEX MICROSCOPIC (NOT AT [facility]) - Abnormal; Notable for the following:    Glucose, UA >1000 (*)    All other components within normal limits  CBG MONITORING, ED - Abnormal; Notable for the following:    Glucose-Capillary 300 (*)    All other components within normal limits  PREGNANCY, URINE  URINE MICROSCOPIC-ADD ON  GC/CHLAMYDIA PROBE AMP ([place]) NOT AT [facility]    Imaging Review Dg Abd 1 View  04/29/2015   CLINICAL DATA:  Left pelvic pain for 2 days.  Left flank pain.  EXAM: ABDOMEN - 1 VIEW  COMPARISON:  CT 04/06/2015  FINDINGS: Moderate stool burden throughout the colon, similar to prior study. Nonobstructive bowel gas pattern. No free air. No suspicious calcification organomegaly. No bony abnormality.  IMPRESSION: Moderate stool burden.  No acute findings.   Electronically Signed   By: Charlett Nose M.D.   On: 04/29/2015 15:52   I have personally reviewed and evaluated these images and lab results as part of my medical decision-making.   MDM   Final diagnoses:   Constipation, unspecified constipation type    Patient presents with pain in her left pelvic area. Her pelvic exam is unremarkable other than some tenderness in the left adnexa. Her pregnancy test is negative. There is no significant discharged to indicate an infectious etiology for the pain. She does have a history of ovarian cyst in the past. I ordered a pelvic ultrasound however after waiting a while, patient did not want to wait anymore. She will follow-up with her OB/GYN.  I personally performed the services described in this documentation, which was scribed in my presence.  The recorded information has been reviewed and considered.    Shawna Bucco, MD 04/29/15 1902

## 2015-04-29 NOTE — Discharge Instructions (Signed)
Abdominal Pain, Women °Abdominal (stomach, pelvic, or belly) pain can be caused by many things. It is important to tell your doctor: °· The location of the pain. °· Does it come and go or is it present all the time? °· Are there things that start the pain (eating certain foods, exercise)? °· Are there other symptoms associated with the pain (fever, nausea, vomiting, diarrhea)? °All of this is helpful to know when trying to find the cause of the pain. °CAUSES  °· Stomach: virus or bacteria infection, or ulcer. °· Intestine: appendicitis (inflamed appendix), regional ileitis (Crohn's disease), ulcerative colitis (inflamed colon), irritable bowel syndrome, diverticulitis (inflamed diverticulum of the colon), or cancer of the stomach or intestine. °· Gallbladder disease or stones in the gallbladder. °· Kidney disease, kidney stones, or infection. °· Pancreas infection or cancer. °· Fibromyalgia (pain disorder). °· Diseases of the female organs: °¨ Uterus: fibroid (non-cancerous) tumors or infection. °¨ Fallopian tubes: infection or tubal pregnancy. °¨ Ovary: cysts or tumors. °¨ Pelvic adhesions (scar tissue). °¨ Endometriosis (uterus lining tissue growing in the pelvis and on the pelvic organs). °¨ Pelvic congestion syndrome (female organs filling up with blood just before the menstrual period). °¨ Pain with the menstrual period. °¨ Pain with ovulation (producing an egg). °¨ Pain with an IUD (intrauterine device, birth control) in the uterus. °¨ Cancer of the female organs. °· Functional pain (pain not caused by a disease, may improve without treatment). °· Psychological pain. °· Depression. °DIAGNOSIS  °Your doctor will decide the seriousness of your pain by doing an examination. °· Blood tests. °· X-rays. °· Ultrasound. °· CT scan (computed tomography, special type of X-ray). °· MRI (magnetic resonance imaging). °· Cultures, for infection. °· Barium enema (dye inserted in the large intestine, to better view it with  X-rays). °· Colonoscopy (looking in intestine with a lighted tube). °· Laparoscopy (minor surgery, looking in abdomen with a lighted tube). °· Major abdominal exploratory surgery (looking in abdomen with a large incision). °TREATMENT  °The treatment will depend on the cause of the pain.  °· Many cases can be observed and treated at home. °· Over-the-counter medicines recommended by your caregiver. °· Prescription medicine. °· Antibiotics, for infection. °· Birth control pills, for painful periods or for ovulation pain. °· Hormone treatment, for endometriosis. °· Nerve blocking injections. °· Physical therapy. °· Antidepressants. °· Counseling with a psychologist or psychiatrist. °· Minor or major surgery. °HOME CARE INSTRUCTIONS  °· Do not take laxatives, unless directed by your caregiver. °· Take over-the-counter pain medicine only if ordered by your caregiver. Do not take aspirin because it can cause an upset stomach or bleeding. °· Try a clear liquid diet (broth or water) as ordered by your caregiver. Slowly move to a bland diet, as tolerated, if the pain is related to the stomach or intestine. °· Have a thermometer and take your temperature several times a day, and record it. °· Bed rest and sleep, if it helps the pain. °· Avoid sexual intercourse, if it causes pain. °· Avoid stressful situations. °· Keep your follow-up appointments and tests, as your caregiver orders. °· If the pain does not go away with medicine or surgery, you may try: °¨ Acupuncture. °¨ Relaxation exercises (yoga, meditation). °¨ Group therapy. °¨ Counseling. °SEEK MEDICAL CARE IF:  °· You notice certain foods cause stomach pain. °· Your home care treatment is not helping your pain. °· You need stronger pain medicine. °· You want your IUD removed. °· You feel faint or   lightheaded. °· You develop nausea and vomiting. °· You develop a rash. °· You are having side effects or an allergy to your medicine. °SEEK IMMEDIATE MEDICAL CARE IF:  °· Your  pain does not go away or gets worse. °· You have a fever. °· Your pain is felt only in portions of the abdomen. The right side could possibly be appendicitis. The left lower portion of the abdomen could be colitis or diverticulitis. °· You are passing blood in your stools (bright red or black tarry stools, with or without vomiting). °· You have blood in your urine. °· You develop chills, with or without a fever. °· You pass out. °MAKE SURE YOU:  °· Understand these instructions. °· Will watch your condition. °· Will get help right away if you are not doing well or get worse. °Document Released: 05/24/2007 Document Revised: 12/11/2013 Document Reviewed: 06/13/2009 °[employer]® Patient Information ©2015 [employer]. This information is not intended to replace advice given to you by your health care provider. Make sure you discuss any questions you have with your health care provider. ° °

## 2015-04-30 LAB — GC/CHLAMYDIA PROBE AMP (~~LOC~~) NOT AT ARMC
Chlamydia: NEGATIVE
Neisseria Gonorrhea: NEGATIVE

## 2015-06-14 ENCOUNTER — Emergency Department (HOSPITAL_BASED_OUTPATIENT_CLINIC_OR_DEPARTMENT_OTHER)
Admission: EM | Admit: 2015-06-14 | Discharge: 2015-06-14 | Disposition: A | Discharge status: Home / Self Care | Payer: 59 | Attending: Emergency Medicine | Admitting: Emergency Medicine

## 2015-06-14 ENCOUNTER — Encounter (HOSPITAL_BASED_OUTPATIENT_CLINIC_OR_DEPARTMENT_OTHER): Payer: Self-pay | Admitting: *Deleted

## 2015-06-14 DIAGNOSIS — N342 Other urethritis: Secondary | ICD-10-CM | POA: Diagnosis not present

## 2015-06-14 DIAGNOSIS — E119 Type 2 diabetes mellitus without complications: Secondary | ICD-10-CM | POA: Diagnosis not present

## 2015-06-14 DIAGNOSIS — E7800 Pure hypercholesterolemia, unspecified: Secondary | ICD-10-CM | POA: Diagnosis not present

## 2015-06-14 DIAGNOSIS — R109 Unspecified abdominal pain: Secondary | ICD-10-CM | POA: Diagnosis present

## 2015-06-14 DIAGNOSIS — Z3202 Encounter for pregnancy test, result negative: Secondary | ICD-10-CM | POA: Insufficient documentation

## 2015-06-14 DIAGNOSIS — Z79899 Other long term (current) drug therapy: Secondary | ICD-10-CM | POA: Insufficient documentation

## 2015-06-14 DIAGNOSIS — N898 Other specified noninflammatory disorders of vagina: Secondary | ICD-10-CM | POA: Diagnosis not present

## 2015-06-14 DIAGNOSIS — Z794 Long term (current) use of insulin: Secondary | ICD-10-CM | POA: Insufficient documentation

## 2015-06-14 LAB — WET PREP, GENITAL
Trich, Wet Prep: NONE SEEN
Yeast Wet Prep HPF POC: NONE SEEN

## 2015-06-14 LAB — URINALYSIS, ROUTINE W REFLEX MICROSCOPIC
Bilirubin Urine: NEGATIVE
GLUCOSE, UA: NEGATIVE mg/dL
HGB URINE DIPSTICK: NEGATIVE
Ketones, ur: NEGATIVE mg/dL
Leukocytes, UA: NEGATIVE
Nitrite: NEGATIVE
PH: 5.5 (ref 5.0–8.0)
Protein, ur: NEGATIVE mg/dL
SPECIFIC GRAVITY, URINE: 1.005 (ref 1.005–1.030)
Urobilinogen, UA: 0.2 mg/dL (ref 0.0–1.0)

## 2015-06-14 LAB — PREGNANCY, URINE: Preg Test, Ur: NEGATIVE

## 2015-06-14 MED ORDER — AZITHROMYCIN 250 MG PO TABS
1000.0000 mg | ORAL_TABLET | Freq: Once | ORAL | Status: AC
  Administered 2015-06-14: 1000 mg via ORAL
  Filled 2015-06-14: qty 4

## 2015-06-14 MED ORDER — LIDOCAINE HCL (PF) 1 % IJ SOLN
INTRAMUSCULAR | Status: AC
  Administered 2015-06-14: 1 mL
  Filled 2015-06-14: qty 5

## 2015-06-14 MED ORDER — METRONIDAZOLE 500 MG PO TABS: 500.0000 mg | ORAL_TABLET | Freq: Two times a day (BID) | ORAL | Status: DC

## 2015-06-14 MED ORDER — CEFTRIAXONE SODIUM 250 MG IJ SOLR
250.0000 mg | Freq: Once | INTRAMUSCULAR | Status: AC
  Administered 2015-06-14: 250 mg via INTRAMUSCULAR
  Filled 2015-06-14: qty 250

## 2015-06-14 NOTE — ED Notes (Signed)
Back pain, lower abdominal pressure. States she thinks she has a UTI.

## 2015-06-14 NOTE — ED Provider Notes (Signed)
CSN: [id number]     Arrival date & time 06/14/15  1814 History  By signing my name below, I, Gonzella LexKimberly Bianca Gray, attest that this documentation has been prepared under the direction and in the presence of Linwood DibblesJon Borna Wessinger, MD. Electronically Signed: Gonzella LexKimberly Bianca Gray, Scribe. 06/14/2015. 6:51 PM.    Chief Complaint  Patient presents with  . Abdominal Pain    The history is provided by the patient. No language interpreter was used.    HPI Comments: Harvel Qualemanda Wolfert is a 39 y.o. female who presents to the Emergency Department complaining of back pain and frequent urination onset one week ago. She suspects possible bacterial or UTI infection. Pt recently had sex and notes that the condom was lodged in her vagina for a period of time before she was able to remove it, and since then "has not felt right." Pt denies vaginal discharge and pain with intercourse. She denies fever, vomiting, and diarrhea. She reports normal appetite.    Past Medical History  Diagnosis Date  . Diabetes mellitus without complication (HCC)   . High cholesterol    Past Surgical History  Procedure Laterality Date  . Ectopic pregnancy surgery    . Tubal ligation     No family history on file. Social History  Substance Use Topics  . Smoking status: Never Smoker   . Smokeless tobacco: None  . Alcohol Use: Yes   OB History    No data available     Review of Systems  Constitutional: Negative for fever.  Gastrointestinal: Negative for vomiting and diarrhea.  Genitourinary: Positive for frequency. Negative for vaginal discharge and vaginal pain.  Musculoskeletal: Positive for back pain.      Allergies  Etodolac and Oxycontin  Home Medications   Prior to Admission medications   Medication Sig Start Date End Date Taking? Authorizing Provider  Levothyroxine Sodium (SYNTHROID PO) Take by mouth.   Yes Historical Provider, MD  glipiZIDE (GLUCOTROL) 10 MG tablet Take 10 mg by mouth 2 (two) times daily before a  meal.    Historical Provider, MD  Insulin Aspart (NOVOLOG [place]) Inject into the skin.    Historical Provider, MD  metFORMIN (GLUCOPHAGE) 500 MG tablet Take 500 mg by mouth 2 (two) times daily with a meal.    Historical Provider, MD  metroNIDAZOLE (FLAGYL) 500 MG tablet Take 1 tablet (500 mg total) by mouth 2 (two) times daily. 06/14/15   Linwood DibblesJon Jewett Mcgann, MD  pantoprazole (PROTONIX) 20 MG tablet Take 40 mg by mouth daily.    Historical Provider, MD  pravastatin (PRAVACHOL) 20 MG tablet Take 20 mg by mouth daily.    Historical Provider, MD   BP 126/85 mmHg  Pulse 97  Temp(Src) 98.2 F (36.8 C) (Oral)  Resp 20  Ht 5\' 4"  (1.626 m)  Wt 175 lb (79.379 kg)  BMI 30.02 kg/m2  SpO2 100%  LMP 05/19/2015 Physical Exam  Constitutional: She appears well-developed and well-nourished. No distress.  HENT:  Head: Normocephalic and atraumatic.  Right Ear: External ear normal.  Left Ear: External ear normal.  Eyes: Conjunctivae are normal. Right eye exhibits no discharge. Left eye exhibits no discharge. No scleral icterus.  Neck: Neck supple. No tracheal deviation present.  Cardiovascular: Normal rate, regular rhythm and intact distal pulses.   Pulmonary/Chest: Effort normal and breath sounds normal. No stridor. No respiratory distress. She has no wheezes. She has no rales.  Abdominal: Soft. Bowel sounds are normal. She exhibits no distension. There is no tenderness. There is  no rebound and no guarding.  Genitourinary: Uterus is not tender. Cervix exhibits discharge. Cervix exhibits no motion tenderness and no friability. Right adnexum displays no tenderness. Left adnexum displays no tenderness. No tenderness or bleeding in the vagina. Vaginal discharge found.  Musculoskeletal: She exhibits no edema or tenderness.  Neurological: She is alert. She has normal strength. No cranial nerve deficit (no facial droop, extraocular movements intact, no slurred speech) or sensory deficit. She exhibits normal muscle tone. She  displays no seizure activity. Coordination normal.  Skin: Skin is warm and dry. No rash noted.  Psychiatric: She has a normal mood and affect.  Nursing note and vitals reviewed.   ED Course  Procedures  DIAGNOSTIC STUDIES:    Oxygen Saturation is 100% on RA, normal by my interpretation.   COORDINATION OF CARE:  6:45 PM Will order urinalysis and lab work. Discussed treatment plan with pt at bedside and pt agreed to plan.    Labs Review Labs Reviewed  WET PREP, GENITAL - Abnormal; Notable for the following:    Clue Cells Wet Prep HPF POC MODERATE (*)    WBC, Wet Prep HPF POC FEW (*)    All other components within normal limits  URINALYSIS, ROUTINE W REFLEX MICROSCOPIC (NOT AT [facility])  PREGNANCY, URINE  RPR  HIV ANTIBODY (ROUTINE TESTING)  GC/CHLAMYDIA PROBE AMP ([place]) NOT AT [facility]    I have personally reviewed and evaluated these lab results as part of my medical decision-making.   MDM   Final diagnoses:  Urethritis    Patient's symptoms are most likely related to urethritis. Urinalysis does not suggest urinary tract infection. Patient was given ceftriaxone and azithromycin. He does have some discharge and exam as well as clue cells on wet prep. I will give her prescription for Flagyl. I recommend follow-up with an OB/GYN doctor or primary care doctor.     Linwood Dibbles, MD 06/14/15 2027

## 2015-06-14 NOTE — Discharge Instructions (Signed)
Urethritis, Adult °Urethritis is an inflammation of the tube through which urine exits your bladder (urethra).  °CAUSES °Urethritis is often caused by an infection in your urethra. The infection can be viral, like herpes. The infection can also be bacterial, like gonorrhea. °RISK FACTORS °Risk factors of urethritis include: °· Having sex without using a condom. °· Having multiple sexual partners. °· Having poor hygiene. °SIGNS AND SYMPTOMS °Symptoms of urethritis are less noticeable in women than in men. These symptoms include: °· Burning feeling when you urinate (dysuria). °· Discharge from your urethra. °· Blood in your urine (hematuria). °· Urinating more than usual. °DIAGNOSIS  °To confirm a diagnosis of urethritis, your health care provider will do the following: °· Ask about your sexual history. °· Perform a physical exam. °· Have you provide a sample of your urine for lab testing. °· Use a cotton swab to gently collect a sample from your urethra for lab testing. °TREATMENT  °It is important to treat urethritis. Depending on the cause, untreated urethritis may lead to serious genital infections and possibly infertility. Urethritis caused by a bacterial infection is treated with antibiotic medicine. All sexual partners must be treated.  °HOME CARE INSTRUCTIONS °· Do not have sex until the test results are known and treatment is completed, even if your symptoms go away before you finish treatment. °· If you were prescribed an antibiotic, finish it all even if you start to feel better. °SEEK MEDICAL CARE IF:  °· Your symptoms are not improved in 3 days. °· Your symptoms are getting worse. °· You develop abdominal pain or pelvic pain (in women). °· You develop joint pain. °· You have a fever. °SEEK IMMEDIATE MEDICAL CARE IF:  °· You have severe pain in the belly, back, or side. °· You have repeated vomiting. °MAKE SURE YOU: °· Understand these instructions. °· Will watch your condition. °· Will get help right away  if you are not doing well or get worse. °  °This information is not intended to replace advice given to you by your health care provider. Make sure you discuss any questions you have with your health care provider. °  °Document Released: 01/20/2001 Document Revised: 12/11/2014 Document Reviewed: 03/27/2013 °Elsevier Interactive Patient Education ©2016 [employer] ° °

## 2015-06-14 NOTE — ED Notes (Signed)
Patient verbalizes understanding of discharge medication and instructions.  Patient stable and ambulatory.

## 2015-06-15 LAB — HIV ANTIBODY (ROUTINE TESTING W REFLEX): HIV SCREEN 4TH GENERATION: NONREACTIVE

## 2015-06-15 LAB — RPR: RPR Ser Ql: NONREACTIVE

## 2015-06-17 LAB — GC/CHLAMYDIA PROBE AMP (~~LOC~~) NOT AT ARMC
Chlamydia: NEGATIVE
NEISSERIA GONORRHEA: NEGATIVE

## 2015-06-24 ENCOUNTER — Telehealth (HOSPITAL_COMMUNITY): Payer: Self-pay

## 2015-07-13 ENCOUNTER — Encounter (HOSPITAL_BASED_OUTPATIENT_CLINIC_OR_DEPARTMENT_OTHER): Payer: Self-pay | Admitting: Emergency Medicine

## 2015-07-13 ENCOUNTER — Emergency Department (HOSPITAL_BASED_OUTPATIENT_CLINIC_OR_DEPARTMENT_OTHER): Payer: 59

## 2015-07-13 ENCOUNTER — Emergency Department (HOSPITAL_BASED_OUTPATIENT_CLINIC_OR_DEPARTMENT_OTHER)
Admission: EM | Admit: 2015-07-13 | Discharge: 2015-07-13 | Disposition: A | Discharge status: Home / Self Care | Payer: 59 | Attending: Emergency Medicine | Admitting: Emergency Medicine

## 2015-07-13 DIAGNOSIS — Z794 Long term (current) use of insulin: Secondary | ICD-10-CM | POA: Diagnosis not present

## 2015-07-13 DIAGNOSIS — E119 Type 2 diabetes mellitus without complications: Secondary | ICD-10-CM | POA: Insufficient documentation

## 2015-07-13 DIAGNOSIS — E7800 Pure hypercholesterolemia, unspecified: Secondary | ICD-10-CM | POA: Diagnosis not present

## 2015-07-13 DIAGNOSIS — Z79899 Other long term (current) drug therapy: Secondary | ICD-10-CM | POA: Diagnosis not present

## 2015-07-13 DIAGNOSIS — M79652 Pain in left thigh: Secondary | ICD-10-CM | POA: Insufficient documentation

## 2015-07-13 DIAGNOSIS — M79605 Pain in left leg: Secondary | ICD-10-CM | POA: Diagnosis present

## 2015-07-13 MED ORDER — CYCLOBENZAPRINE HCL 10 MG PO TABS: 10.0000 mg | ORAL_TABLET | Freq: Three times a day (TID) | ORAL | Status: DC | PRN

## 2015-07-13 MED ORDER — CYCLOBENZAPRINE HCL 10 MG PO TABS
10.0000 mg | ORAL_TABLET | Freq: Once | ORAL | Status: AC
  Administered 2015-07-13: 10 mg via ORAL
  Filled 2015-07-13: qty 1

## 2015-07-13 NOTE — ED Notes (Signed)
Pt states she had pain to left groin area that radiated around to to back of left leg

## 2015-07-13 NOTE — Discharge Instructions (Signed)

## 2015-07-13 NOTE — ED Notes (Signed)
Patient transported to Ultrasound 

## 2015-07-13 NOTE — ED Provider Notes (Signed)
CSN: [id number]     Arrival date & time 07/13/15  1006 History   First MD Initiated Contact with Patient 07/13/15 1102     Chief Complaint  Patient presents with  . Leg Pain     (Consider location/radiation/quality/duration/timing/severity/associated sxs/prior Treatment) HPI  39 year old female presents today complaining of left groin pain that began 1 week ago. She states it occurred while she was sitting down at Thanksgiving dinner and she had pain when she went to stand and move. Pain is increased with any flexion or extension of her trunk especially after she has been sitting. She denies any injury, swelling, history of DVT, or history of PE. She denies any redness or signs of infection.  Past Medical History  Diagnosis Date  . Diabetes mellitus without complication (HCC)   . High cholesterol    Past Surgical History  Procedure Laterality Date  . Ectopic pregnancy surgery    . Tubal ligation     History reviewed. No pertinent family history. Social History  Substance Use Topics  . Smoking status: Never Smoker   . Smokeless tobacco: None  . Alcohol Use: Yes   OB History    No data available     Review of Systems  All other systems reviewed and are negative.     Allergies  Etodolac and Oxycontin  Home Medications   Prior to Admission medications   Medication Sig Start Date End Date Taking? Authorizing Provider  glipiZIDE (GLUCOTROL) 10 MG tablet Take 10 mg by mouth 2 (two) times daily before a meal.   Yes Historical Provider, MD  Insulin Aspart (NOVOLOG [place]) Inject into the skin.   Yes Historical Provider, MD  Levothyroxine Sodium (SYNTHROID PO) Take by mouth.   Yes Historical Provider, MD  metFORMIN (GLUCOPHAGE) 500 MG tablet Take 500 mg by mouth 2 (two) times daily with a meal.   Yes Historical Provider, MD  pantoprazole (PROTONIX) 20 MG tablet Take 40 mg by mouth daily.   Yes Historical Provider, MD  pravastatin (PRAVACHOL) 20 MG tablet Take 20 mg by mouth  daily.   Yes Historical Provider, MD   BP 134/73 mmHg  Pulse 87  Temp(Src) 97.9 F (36.6 C) (Oral)  Resp 18  Ht 5' 4.5" (1.638 m)  Wt 77.111 kg  BMI 28.74 kg/m2  SpO2 100%  LMP 06/19/2015 Physical Exam  Constitutional: She is oriented to person, place, and time. She appears well-developed and well-nourished.  HENT:  Head: Normocephalic and atraumatic.  Right Ear: External ear normal.  Left Ear: External ear normal.  Nose: Nose normal.  Mouth/Throat: Oropharynx is clear and moist.  Eyes: Conjunctivae and EOM are normal. Pupils are equal, round, and reactive to light.  Neck: Normal range of motion. Neck supple. No JVD present. No tracheal deviation present. No thyromegaly present.  Cardiovascular: Normal rate, regular rhythm, normal heart sounds and intact distal pulses.   Pulmonary/Chest: Effort normal and breath sounds normal. She has no wheezes.  Abdominal: Soft. Bowel sounds are normal. She exhibits no mass. There is no tenderness. There is no guarding.  Musculoskeletal: Normal range of motion. She exhibits tenderness. She exhibits no edema.       Legs: Lymphadenopathy:    She has no cervical adenopathy.  Neurological: She is alert and oriented to person, place, and time. She has normal reflexes. No cranial nerve deficit or sensory deficit. Gait normal. GCS eye subscore is 4. GCS verbal subscore is 5. GCS motor subscore is 6.  Reflex Scores:  Bicep reflexes are 2+ on the right side and 2+ on the left side.      Patellar reflexes are 2+ on the right side and 2+ on the left side. Strength is normal and equal throughout. Cranial nerves grossly intact. Patient fluent. No gross ataxia and patient able to ambulate without difficulty.  Skin: Skin is warm and dry.  Psychiatric: She has a normal mood and affect. Her behavior is normal. Judgment and thought content normal.  Nursing note and vitals reviewed.   ED Course  Procedures (including critical care time) Labs  Review Labs Reviewed - No data to display  Imaging Review US Venous Img Lower Unilateral Left  07/13/2015  CLINICAL DATA:  Left posterior thigh pain for 1 week. No known injury. Initial encounter. EXAM: LEFT LOWER EXTREMITY VENOUS DOPPLER ULTRASOUND TECHNIQUE: Gray-scale sonography with graded compression, as well as color Doppler and duplex ultrasound were performed to evaluate the lower extremity deep venous systems from the level of the common femoral vein and including the common femoral, femoral, profunda femoral, popliteal and calf veins including the posterior tibial, peroneal and gastrocnemius veins when visible. The superficial great saphenous vein was also interrogated. Spectral Doppler was utilized to evaluate flow at rest and with distal augmentation maneuvers in the common femoral, femoral and popliteal veins. COMPARISON:  None. FINDINGS: Contralateral Common Femoral Vein: Respiratory phasicity is normal and symmetric with the symptomatic side. No evidence of thrombus. Normal compressibility. Common Femoral Vein: No evidence of thrombus. Normal compressibility, respiratory phasicity and response to augmentation. Saphenofemoral Junction: No evidence of thrombus. Normal compressibility and flow on color Doppler imaging. Profunda Femoral Vein: No evidence of thrombus. Normal compressibility and flow on color Doppler imaging. Femoral Vein: No evidence of thrombus. Normal compressibility, respiratory phasicity and response to augmentation. Popliteal Vein: No evidence of thrombus. Normal compressibility, respiratory phasicity and response to augmentation. Calf Veins: No evidence of thrombus. Normal compressibility and flow on color Doppler imaging. Superficial Great Saphenous Vein: No evidence of thrombus. Normal compressibility and flow on color Doppler imaging. Other Findings: No abnormality was visualized in posteromedial left thigh at area of pain. IMPRESSION: No evidence of acute left lower  extremity DVT. Electronically Signed   By: Carey Bullocks M.D.   On: 07/13/2015 12:32   I have personally reviewed and evaluated these images and lab results as part of my medical decision-making.   EKG Interpretation None      MDM   Final diagnoses:  Left thigh pain   patient evaluated with ultrasound and shows no evidence of DVT. No bony point tenderness noted. Patient was walked range of motion. No redness or swelling to indicate infection. I suspect this is musculoskeletal in nature. She has improved here with Flexeril. She is given referral to orthopedics for follow-up.    Margarita Grizzle, MD 07/13/15 1341

## 2015-09-10 ENCOUNTER — Encounter (HOSPITAL_BASED_OUTPATIENT_CLINIC_OR_DEPARTMENT_OTHER): Payer: Self-pay | Admitting: Emergency Medicine

## 2015-09-10 ENCOUNTER — Emergency Department (HOSPITAL_BASED_OUTPATIENT_CLINIC_OR_DEPARTMENT_OTHER)
Admission: EM | Admit: 2015-09-10 | Discharge: 2015-09-10 | Disposition: A | Discharge status: Home / Self Care | Payer: 59 | Attending: Emergency Medicine | Admitting: Emergency Medicine

## 2015-09-10 DIAGNOSIS — J069 Acute upper respiratory infection, unspecified: Secondary | ICD-10-CM | POA: Insufficient documentation

## 2015-09-10 DIAGNOSIS — E119 Type 2 diabetes mellitus without complications: Secondary | ICD-10-CM | POA: Diagnosis not present

## 2015-09-10 DIAGNOSIS — E7800 Pure hypercholesterolemia, unspecified: Secondary | ICD-10-CM | POA: Insufficient documentation

## 2015-09-10 DIAGNOSIS — Z794 Long term (current) use of insulin: Secondary | ICD-10-CM | POA: Diagnosis not present

## 2015-09-10 DIAGNOSIS — Z79899 Other long term (current) drug therapy: Secondary | ICD-10-CM | POA: Insufficient documentation

## 2015-09-10 DIAGNOSIS — R51 Headache: Secondary | ICD-10-CM | POA: Diagnosis present

## 2015-09-10 DIAGNOSIS — H6691 Otitis media, unspecified, right ear: Secondary | ICD-10-CM | POA: Insufficient documentation

## 2015-09-10 DIAGNOSIS — Z7984 Long term (current) use of oral hypoglycemic drugs: Secondary | ICD-10-CM | POA: Insufficient documentation

## 2015-09-10 LAB — RAPID STREP SCREEN (MED CTR MEBANE ONLY): Streptococcus, Group A Screen (Direct): NEGATIVE

## 2015-09-10 MED ORDER — DM-GUAIFENESIN ER 30-600 MG PO TB12: 1.0000 | ORAL_TABLET | Freq: Two times a day (BID) | ORAL | Status: DC

## 2015-09-10 MED ORDER — AZITHROMYCIN 250 MG PO TABS
250.0000 mg | ORAL_TABLET | Freq: Every day | ORAL | Status: DC
Start: 2015-09-10 — End: 2016-06-02

## 2015-09-10 NOTE — Discharge Instructions (Signed)
Take the antibiotic as directed for the ear infection. Take the Mucinex DM for the phlegm and cough. Work note provided. Return for any new or worse symptoms. Rapid strep test was negative.

## 2015-09-10 NOTE — ED Notes (Signed)
Pt reports onset sore throat, bilateral ear pain and sinus congestion 1/28

## 2015-09-10 NOTE — ED Provider Notes (Signed)
CSN: [id number]     Arrival date & time 09/10/15  1051 History   First MD Initiated Contact with Patient 09/10/15 1132     Chief Complaint  Patient presents with  . Sore Throat  . Facial Pain     (Consider location/radiation/quality/duration/timing/severity/associated sxs/prior Treatment) Patient is a 40 y.o. female presenting with pharyngitis. The history is provided by the patient.  Sore Throat Pertinent negatives include no chest pain, no abdominal pain, no headaches and no shortness of breath.   patient with onset of upper respiratory symptoms on Saturday. Complaint today is congestion and sinus pressure sneezing some cough and sore throat and bilateral ear pain. Positive sick contact at home with her son has an upper respiratory infection. Patient denies nausea vomiting or diarrhea denies fevers.  Past Medical History  Diagnosis Date  . Diabetes mellitus without complication (HCC)   . High cholesterol    Past Surgical History  Procedure Laterality Date  . Ectopic pregnancy surgery    . Tubal ligation     History reviewed. No pertinent family history. Social History  Substance Use Topics  . Smoking status: Never Smoker   . Smokeless tobacco: None  . Alcohol Use: Yes   OB History    No data available     Review of Systems  Constitutional: Negative for fever.  HENT: Positive for congestion, sinus pressure and sore throat.   Eyes: Negative for visual disturbance.  Respiratory: Positive for cough. Negative for shortness of breath.   Cardiovascular: Negative for chest pain.  Gastrointestinal: Negative for nausea, vomiting and abdominal pain.  Genitourinary: Negative for dysuria.  Musculoskeletal: Negative for neck pain and neck stiffness.  Skin: Negative for rash.  Neurological: Negative for headaches.  Hematological: Does not bruise/bleed easily.  Psychiatric/Behavioral: Negative for confusion.      Allergies  Etodolac and Oxycontin  Home Medications   Prior  to Admission medications   Medication Sig Start Date End Date Taking? Authorizing Provider  diphenhydrAMINE (SOMINEX) 25 MG tablet Take 25 mg by mouth at bedtime as needed for sleep.   Yes Historical Provider, MD  glipiZIDE (GLUCOTROL) 10 MG tablet Take 10 mg by mouth 2 (two) times daily before a meal.   Yes Historical Provider, MD  Insulin Aspart (NOVOLOG [place]) Inject into the skin.   Yes Historical Provider, MD  Levothyroxine Sodium (SYNTHROID PO) Take by mouth.   Yes Historical Provider, MD  metFORMIN (GLUCOPHAGE) 500 MG tablet Take 500 mg by mouth 2 (two) times daily with a meal.   Yes Historical Provider, MD  pantoprazole (PROTONIX) 20 MG tablet Take 40 mg by mouth daily.   Yes Historical Provider, MD  pravastatin (PRAVACHOL) 20 MG tablet Take 20 mg by mouth daily.   Yes Historical Provider, MD  sodium-potassium bicarbonate (ALKA-SELTZER GOLD) TBEF dissolvable tablet Take 1 tablet by mouth daily as needed.   Yes Historical Provider, MD  azithromycin (ZITHROMAX) 250 MG tablet Take 1 tablet (250 mg total) by mouth daily. Take first 2 tablets together, then 1 every day until finished. 09/10/15   Vanetta Mulders, MD  dextromethorphan-guaiFENesin (MUCINEX DM) 30-600 MG 12hr tablet Take 1 tablet by mouth 2 (two) times daily. 09/10/15   Vanetta Mulders, MD   BP 143/92 mmHg  Pulse 90  Temp(Src) 98.5 F (36.9 C) (Oral)  Resp 20  Ht  (1.626 m)  Wt 77.111 kg  BMI 29.17 kg/m2  SpO2 100%  LMP 08/14/2015 Physical Exam  Constitutional: She is oriented to person, place, and  time. She appears well-developed and well-nourished. No distress.  HENT:  Head: Normocephalic and atraumatic.  Left Ear: External ear normal.  Mouth/Throat: Oropharynx is clear and moist. No oropharyngeal exudate.  Right ear erythematous and bulging.  Eyes: Conjunctivae and EOM are normal. Pupils are equal, round, and reactive to light.  Neck: Normal range of motion. Neck supple.  Cardiovascular: Normal rate, regular rhythm  and normal heart sounds.   No murmur heard. Pulmonary/Chest: Effort normal and breath sounds normal. No respiratory distress.  Abdominal: Soft. Bowel sounds are normal. There is no tenderness.  Musculoskeletal: Normal range of motion.  Lymphadenopathy:    She has no cervical adenopathy.  Neurological: She is alert and oriented to person, place, and time. No cranial nerve deficit. She exhibits normal muscle tone.  Skin: Skin is warm. No rash noted.  Nursing note and vitals reviewed.   ED Course  Procedures (including critical care time) Labs Review Labs Reviewed  RAPID STREP SCREEN (NOT AT [facility])  CULTURE, GROUP A STREP [facility])    Imaging Review No results found. I have personally reviewed and evaluated these images and lab results as part of my medical decision-making.   EKG Interpretation None      MDM   Final diagnoses:  URI (upper respiratory infection)  Acute right otitis media, recurrence not specified, unspecified otitis media type    Symptoms seem to be consistent with an upper respiratory infection. Rapid strep was negative no evidence of strep throat. But right ear does show signs of early ear infection will treat with antibiotic Zithromax and otherwise symptomatic treatment for the cold. Patient nontoxic no acute distress.    Vanetta Mulders, MD 09/10/15 1152

## 2015-09-11 LAB — CULTURE, GROUP A STREP (THRC)

## 2015-09-13 ENCOUNTER — Telehealth: Payer: Self-pay | Admitting: *Deleted

## 2015-09-13 NOTE — ED Notes (Signed)
(+)  strep culture, treated with Azithromycin, OK per Isaac Bliss, Pharm

## 2015-10-18 ENCOUNTER — Encounter (HOSPITAL_BASED_OUTPATIENT_CLINIC_OR_DEPARTMENT_OTHER): Payer: Self-pay | Admitting: Emergency Medicine

## 2015-10-18 ENCOUNTER — Emergency Department (HOSPITAL_BASED_OUTPATIENT_CLINIC_OR_DEPARTMENT_OTHER): Payer: 59

## 2015-10-18 ENCOUNTER — Emergency Department (HOSPITAL_BASED_OUTPATIENT_CLINIC_OR_DEPARTMENT_OTHER)
Admission: EM | Admit: 2015-10-18 | Discharge: 2015-10-18 | Disposition: A | Discharge status: Home / Self Care | Payer: 59 | Attending: Emergency Medicine | Admitting: Emergency Medicine

## 2015-10-18 DIAGNOSIS — Z7984 Long term (current) use of oral hypoglycemic drugs: Secondary | ICD-10-CM | POA: Diagnosis not present

## 2015-10-18 DIAGNOSIS — E7800 Pure hypercholesterolemia, unspecified: Secondary | ICD-10-CM | POA: Insufficient documentation

## 2015-10-18 DIAGNOSIS — E119 Type 2 diabetes mellitus without complications: Secondary | ICD-10-CM | POA: Diagnosis not present

## 2015-10-18 DIAGNOSIS — Z794 Long term (current) use of insulin: Secondary | ICD-10-CM | POA: Diagnosis not present

## 2015-10-18 DIAGNOSIS — Z79899 Other long term (current) drug therapy: Secondary | ICD-10-CM | POA: Diagnosis not present

## 2015-10-18 DIAGNOSIS — M5432 Sciatica, left side: Secondary | ICD-10-CM | POA: Insufficient documentation

## 2015-10-18 DIAGNOSIS — M79605 Pain in left leg: Secondary | ICD-10-CM | POA: Diagnosis present

## 2015-10-18 MED ORDER — ONDANSETRON 4 MG PO TBDP
4.0000 mg | ORAL_TABLET | Freq: Once | ORAL | Status: AC
  Administered 2015-10-18: 4 mg via ORAL
  Filled 2015-10-18: qty 1

## 2015-10-18 MED ORDER — PREDNISONE 10 MG PO TABS: 40.0000 mg | ORAL_TABLET | Freq: Every day | ORAL | Status: DC

## 2015-10-18 MED ORDER — PREDNISONE 50 MG PO TABS
60.0000 mg | ORAL_TABLET | Freq: Once | ORAL | Status: AC
  Administered 2015-10-18: 60 mg via ORAL
  Filled 2015-10-18: qty 1

## 2015-10-18 MED ORDER — HYDROMORPHONE HCL 2 MG/ML IJ SOLN
2.0000 mg | Freq: Once | INTRAMUSCULAR | Status: DC
  Filled 2015-10-18: qty 1

## 2015-10-18 MED ORDER — HYDROMORPHONE HCL 1 MG/ML IJ SOLN
INTRAMUSCULAR | Status: AC
  Administered 2015-10-18: 2 mg
  Filled 2015-10-18: qty 2

## 2015-10-18 MED ORDER — HYDROCODONE-ACETAMINOPHEN 5-325 MG PO TABS: 1.0000 | ORAL_TABLET | Freq: Four times a day (QID) | ORAL | Status: DC | PRN

## 2015-10-18 NOTE — ED Notes (Signed)
Attempted to give meds   Pt in xray

## 2015-10-18 NOTE — ED Provider Notes (Signed)
CSN: [id number]     Arrival date & time 10/18/15  0803 History   First MD Initiated Contact with Patient 10/18/15 [phone]     Chief Complaint  Patient presents with  . Leg Pain     (Consider location/radiation/quality/duration/timing/severity/associated sxs/prior Treatment) Patient is a 40 y.o. female presenting with leg pain. The history is provided by the patient.  Leg Pain Associated symptoms: back pain   Associated symptoms: no fever and no neck pain   Patient with complaint of left-sided buttocks pain. Radiates down the back of the leg with a numbness and burning sensation to the top of her left foot. Patient with some back problems following after Thanksgiving or more pain in the groin and thigh area.  Patient states today's pain is 10 out of 10. Present for 2 days. No fall or injury.  Past Medical History  Diagnosis Date  . Diabetes mellitus without complication (HCC)   . High cholesterol    Past Surgical History  Procedure Laterality Date  . Ectopic pregnancy surgery    . Tubal ligation     No family history on file. Social History  Substance Use Topics  . Smoking status: Never Smoker   . Smokeless tobacco: None  . Alcohol Use: Yes   OB History    No data available     Review of Systems  Constitutional: Negative for fever.  HENT: Negative for congestion.   Eyes: Negative for visual disturbance.  Respiratory: Negative for shortness of breath.   Cardiovascular: Negative for chest pain.  Gastrointestinal: Negative for nausea, vomiting and abdominal pain.  Genitourinary: Negative for dysuria.  Musculoskeletal: Positive for back pain. Negative for neck pain.  Skin: Negative for rash.  Neurological: Positive for numbness. Negative for headaches.  Hematological: Does not bruise/bleed easily.  Psychiatric/Behavioral: Negative for confusion.      Allergies  Etodolac; Macrolides and ketolides; Meloxicam; and Oxycontin  Home Medications   Prior to Admission  medications   Medication Sig Start Date End Date Taking? Authorizing Provider  azithromycin (ZITHROMAX) 250 MG tablet Take 1 tablet (250 mg total) by mouth daily. Take first 2 tablets together, then 1 every day until finished. 09/10/15   Vanetta MuldersScott Ryon Layton, MD  dextromethorphan-guaiFENesin (MUCINEX DM) 30-600 MG 12hr tablet Take 1 tablet by mouth 2 (two) times daily. 09/10/15   Vanetta MuldersScott Kewon Statler, MD  diphenhydrAMINE (SOMINEX) 25 MG tablet Take 25 mg by mouth at bedtime as needed for sleep.    Historical Provider, MD  glipiZIDE (GLUCOTROL) 10 MG tablet Take 10 mg by mouth 2 (two) times daily before a meal.    Historical Provider, MD  HYDROcodone-acetaminophen (NORCO/VICODIN) 5-325 MG tablet Take 1-2 tablets by mouth every 6 (six) hours as needed for moderate pain. 10/18/15   Vanetta MuldersScott Mayara Paulson, MD  Insulin Aspart (NOVOLOG [place]) Inject into the skin.    Historical Provider, MD  Levothyroxine Sodium (SYNTHROID PO) Take by mouth.    Historical Provider, MD  metFORMIN (GLUCOPHAGE) 500 MG tablet Take 500 mg by mouth 2 (two) times daily with a meal.    Historical Provider, MD  pantoprazole (PROTONIX) 20 MG tablet Take 40 mg by mouth daily.    Historical Provider, MD  pravastatin (PRAVACHOL) 20 MG tablet Take 20 mg by mouth daily.    Historical Provider, MD  predniSONE (DELTASONE) 10 MG tablet Take 4 tablets (40 mg total) by mouth daily. 10/18/15   Vanetta MuldersScott Alik Mawson, MD  sodium-potassium bicarbonate (ALKA-SELTZER GOLD) TBEF dissolvable tablet Take 1 tablet by mouth daily as  needed.    Historical Provider, MD   BP 138/92 mmHg  Pulse 104  Temp(Src) 98.9 F (37.2 C) (Oral)  Resp 20  Ht 5' 4.5" (1.638 m)  Wt 75.751 kg  BMI 28.23 kg/m2  SpO2 98%  LMP 10/12/2015 Physical Exam  Constitutional: She is oriented to person, place, and time. She appears well-developed and well-nourished. No distress.  HENT:  Head: Normocephalic and atraumatic.  Mouth/Throat: Oropharynx is clear and moist.  Eyes: Conjunctivae are normal.  Pupils are equal, round, and reactive to light.  Neck: Normal range of motion. Neck supple.  Cardiovascular: Normal rate, regular rhythm and normal heart sounds.   No murmur heard. Pulmonary/Chest: Effort normal and breath sounds normal. No respiratory distress.  Abdominal: Soft. Bowel sounds are normal. There is no tenderness.  Musculoskeletal: Normal range of motion. She exhibits no tenderness.  No tenderness to palpation of the lumbar spine  Neurological: She is alert and oriented to person, place, and time. No cranial nerve deficit. She exhibits normal muscle tone. Coordination normal.  Decreased sensation to the top of the left foot.  Skin: Skin is warm. No rash noted.  Nursing note and vitals reviewed.   ED Course  Procedures (including critical care time) Labs Review Labs Reviewed - No data to display  Imaging Review Dg Lumbar Spine Complete  10/18/2015  CLINICAL DATA:  Anterior left hip pain that radiates back and down the left leg. Left foot numbness. EXAM: LUMBAR SPINE - COMPLETE 4+ VIEW COMPARISON:  Abdominal CT 04/06/2015 FINDINGS: Normal alignment of the lumbar spine. No evidence for a fracture. The vertebral body heights are maintained. Disc spaces are maintained. Mild facet arthropathy at L5-S1. Negative for a pars defect. Mild degenerative endplate changes at L4-L5. IMPRESSION: No acute abnormality. Minimal degenerative changes in lower lumbar spine. Electronically Signed   By: Richarda Overlie M.D.   On: 10/18/2015 09:11   I have personally reviewed and evaluated these images and lab results as part of my medical decision-making.   EKG Interpretation None      MDM   Final diagnoses:  Sciatica of left side    Patient with onset of pain in the left buttocks and down the posterior part of the leg with increased numbness to the top of foot and a burning sensation. No fall or injury. Patient had something similar but not as extensive shortly after Thanksgiving lasted 2 weeks  then resolved. Today symptoms are consistent with a left-sided sciatica. X-rays of the back show early degenerative changes. But no significant bony injury.   I will treat patient symptomatically with a course of prednisone, hydrocodone, and Flexeril. Patient will follow-up with her primary care doctor. If symptoms are not improving around 2 week point then MRI would be appropriate for further evaluation.    Vanetta Mulders, MD 10/18/15 [phone]

## 2015-10-18 NOTE — Discharge Instructions (Signed)
Take the medicines as directed along with your Flexeril. Rest is much as possible off your feet this weekend. Make an appointment to follow-up with your regular doctor. If symptoms don't start to improve at around the 2 week mark then an MRI of your back would be appropriate. Return for any new or worse symptoms

## 2015-10-18 NOTE — ED Notes (Signed)
Pain left buttock and leg

## 2015-11-14 DIAGNOSIS — IMO0001 Reserved for inherently not codable concepts without codable children: Secondary | ICD-10-CM | POA: Insufficient documentation

## 2015-11-14 DIAGNOSIS — E039 Hypothyroidism, unspecified: Secondary | ICD-10-CM | POA: Insufficient documentation

## 2015-11-14 DIAGNOSIS — Z794 Long term (current) use of insulin: Secondary | ICD-10-CM

## 2015-11-14 DIAGNOSIS — E1165 Type 2 diabetes mellitus with hyperglycemia: Secondary | ICD-10-CM

## 2015-11-14 DIAGNOSIS — E782 Mixed hyperlipidemia: Secondary | ICD-10-CM | POA: Insufficient documentation

## 2016-02-17 ENCOUNTER — Encounter (HOSPITAL_BASED_OUTPATIENT_CLINIC_OR_DEPARTMENT_OTHER): Payer: Self-pay | Admitting: *Deleted

## 2016-02-17 ENCOUNTER — Emergency Department (HOSPITAL_BASED_OUTPATIENT_CLINIC_OR_DEPARTMENT_OTHER)
Admission: EM | Admit: 2016-02-17 | Discharge: 2016-02-17 | Disposition: A | Discharge status: Home / Self Care | Payer: 59 | Attending: Emergency Medicine | Admitting: Emergency Medicine

## 2016-02-17 DIAGNOSIS — Z87891 Personal history of nicotine dependence: Secondary | ICD-10-CM | POA: Insufficient documentation

## 2016-02-17 DIAGNOSIS — E119 Type 2 diabetes mellitus without complications: Secondary | ICD-10-CM | POA: Insufficient documentation

## 2016-02-17 DIAGNOSIS — M5442 Lumbago with sciatica, left side: Secondary | ICD-10-CM | POA: Diagnosis present

## 2016-02-17 DIAGNOSIS — Z79899 Other long term (current) drug therapy: Secondary | ICD-10-CM | POA: Insufficient documentation

## 2016-02-17 DIAGNOSIS — M5432 Sciatica, left side: Secondary | ICD-10-CM

## 2016-02-17 DIAGNOSIS — Z794 Long term (current) use of insulin: Secondary | ICD-10-CM | POA: Diagnosis not present

## 2016-02-17 DIAGNOSIS — Z7984 Long term (current) use of oral hypoglycemic drugs: Secondary | ICD-10-CM | POA: Diagnosis not present

## 2016-02-17 MED ORDER — KETOROLAC TROMETHAMINE 60 MG/2ML IM SOLN
30.0000 mg | Freq: Once | INTRAMUSCULAR | Status: AC
  Administered 2016-02-17: 30 mg via INTRAMUSCULAR
  Filled 2016-02-17: qty 2

## 2016-02-17 MED ORDER — DIAZEPAM 5 MG PO TABS
5.0000 mg | ORAL_TABLET | Freq: Once | ORAL | Status: AC
  Administered 2016-02-17: 5 mg via ORAL
  Filled 2016-02-17: qty 1

## 2016-02-17 MED ORDER — ACETAMINOPHEN 500 MG PO TABS
1000.0000 mg | ORAL_TABLET | Freq: Once | ORAL | Status: AC
  Administered 2016-02-17: 1000 mg via ORAL
  Filled 2016-02-17: qty 2

## 2016-02-17 MED ORDER — OXYCODONE HCL 5 MG PO TABS
5.0000 mg | ORAL_TABLET | Freq: Once | ORAL | Status: AC
  Administered 2016-02-17: 5 mg via ORAL
  Filled 2016-02-17: qty 1

## 2016-02-17 NOTE — Discharge Instructions (Signed)
Take 4 over the counter ibuprofen tablets 3 times a day or 2 over-the-counter naproxen tablets twice a day for pain. Also take tylenol 1000mg (2 extra strength) four times a day.    Sciatica Sciatica is pain, weakness, numbness, or tingling along the path of the sciatic nerve. The nerve starts in the lower back and runs down the back of each leg. The nerve controls the muscles in the lower leg and in the back of the knee, while also providing sensation to the back of the thigh, lower leg, and the sole of your foot. Sciatica is a symptom of another medical condition. For instance, nerve damage or certain conditions, such as a herniated disk or bone spur on the spine, pinch or put pressure on the sciatic nerve. This causes the pain, weakness, or other sensations normally associated with sciatica. Generally, sciatica only affects one side of the body. CAUSES   Herniated or slipped disc.  Degenerative disk disease.  A pain disorder involving the narrow muscle in the buttocks (piriformis syndrome).  Pelvic injury or fracture.  Pregnancy.  Tumor (rare). SYMPTOMS  Symptoms can vary from mild to very severe. The symptoms usually travel from the low back to the buttocks and down the back of the leg. Symptoms can include:  Mild tingling or dull aches in the lower back, leg, or hip.  Numbness in the back of the calf or sole of the foot.  Burning sensations in the lower back, leg, or hip.  Sharp pains in the lower back, leg, or hip.  Leg weakness.  Severe back pain inhibiting movement. These symptoms may get worse with coughing, sneezing, laughing, or prolonged sitting or standing. Also, being overweight may worsen symptoms. DIAGNOSIS  Your caregiver will perform a physical exam to look for common symptoms of sciatica. He or she may ask you to do certain movements or activities that would trigger sciatic nerve pain. Other tests may be performed to find the cause of the sciatica. These may  include:  Blood tests.  X-rays.  Imaging tests, such as an MRI or CT scan. TREATMENT  Treatment is directed at the cause of the sciatic pain. Sometimes, treatment is not necessary and the pain and discomfort goes away on its own. If treatment is needed, your caregiver may suggest:  Over-the-counter medicines to relieve pain.  Prescription medicines, such as anti-inflammatory medicine, muscle relaxants, or narcotics.  Applying heat or ice to the painful area.  Steroid injections to lessen pain, irritation, and inflammation around the nerve.  Reducing activity during periods of pain.  Exercising and stretching to strengthen your abdomen and improve flexibility of your spine. Your caregiver may suggest losing weight if the extra weight makes the back pain worse.  Physical therapy.  Surgery to eliminate what is pressing or pinching the nerve, such as a bone spur or part of a herniated disk. HOME CARE INSTRUCTIONS   Only take over-the-counter or prescription medicines for pain or discomfort as directed by your caregiver.  Apply ice to the affected area for 20 minutes, 3-4 times a day for the first 48-72 hours. Then try heat in the same way.  Exercise, stretch, or perform your usual activities if these do not aggravate your pain.  Attend physical therapy sessions as directed by your caregiver.  Keep all follow-up appointments as directed by your caregiver.  Do not wear high heels or shoes that do not provide proper support.  Check your mattress to see if it is too soft. A firm  mattress may lessen your pain and discomfort. SEEK IMMEDIATE MEDICAL CARE IF:   You lose control of your bowel or bladder (incontinence).  You have increasing weakness in the lower back, pelvis, buttocks, or legs.  You have redness or swelling of your back.  You have a burning sensation when you urinate.  You have pain that gets worse when you lie down or awakens you at night.  Your pain is worse  than you have experienced in the past.  Your pain is lasting longer than 4 weeks.  You are suddenly losing weight without reason. MAKE SURE YOU:  Understand these instructions.  Will watch your condition.  Will get help right away if you are not doing well or get worse.   This information is not intended to replace advice given to you by your health care provider. Make sure you discuss any questions you have with your health care provider.   Document Released: 07/21/2001 Document Revised: 04/17/2015 Document Reviewed: 12/06/2011 Elsevier Interactive Patient Education [employer]

## 2016-02-17 NOTE — ED Notes (Signed)
C/o low back pain radiating into left leg x 1 day. Hx of sciatica. States has been exercising and stretching. Pt reports difficulty bearing weight on left leg

## 2016-02-17 NOTE — ED Provider Notes (Signed)
CSN: [id number]     Arrival date & time 02/17/16  0932 History   First MD Initiated Contact with Patient 02/17/16 1002     Chief Complaint  Patient presents with  . Back Pain     (Consider location/radiation/quality/duration/timing/severity/associated sxs/prior Treatment) Patient is a 40 y.o. female presenting with back pain. The history is provided by the patient.  Back Pain Location:  Lumbar spine Quality:  Aching and shooting Radiates to:  L foot Pain severity:  Severe Onset quality:  Gradual Duration:  2 days Timing:  Constant Progression:  Worsening Chronicity:  New Relieved by:  Nothing Worsened by:  Nothing tried Ineffective treatments:  None tried Associated symptoms: no chest pain, no dysuria, no fever and no headaches     40 yo F With a chief complaint of left-sided sciatica. This been going on for the past couple days. This recurrent issue for this patient. She denies injury. Denies fevers or chills. Denies loss of bowel or bladder. Denies loss perirectal sensation. Denies weakness to the left lower extremity.  Past Medical History  Diagnosis Date  . Diabetes mellitus without complication (HCC)   . High cholesterol    Past Surgical History  Procedure Laterality Date  . Ectopic pregnancy surgery    . Tubal ligation     No family history on file. Social History  Substance Use Topics  . Smoking status: Former Games developermoker  . Smokeless tobacco: Never Used  . Alcohol Use: Yes     Comment: 2x week   OB History    No data available     Review of Systems  Constitutional: Negative for fever and chills.  HENT: Negative for congestion and rhinorrhea.   Eyes: Negative for redness and visual disturbance.  Respiratory: Negative for shortness of breath and wheezing.   Cardiovascular: Negative for chest pain and palpitations.  Gastrointestinal: Negative for nausea and vomiting.  Genitourinary: Negative for dysuria and urgency.  Musculoskeletal: Positive for back pain,  arthralgias and gait problem. Negative for myalgias.  Skin: Negative for pallor and wound.  Neurological: Negative for dizziness and headaches.      Allergies  Etodolac; Macrolides and ketolides; Meloxicam; and Oxycontin  Home Medications   Prior to Admission medications   Medication Sig Start Date End Date Taking? Authorizing Provider  glipiZIDE (GLUCOTROL) 10 MG tablet Take 10 mg by mouth 2 (two) times daily before a meal.   Yes Historical Provider, MD  insulin lispro protamine-lispro (HUMALOG 75/25 MIX) (75-25) 100 UNIT/ML SUSP injection Inject 50 Units into the skin 2 (two) times daily with a meal. 50 units AM, 55 units PM   Yes Historical Provider, MD  metFORMIN (GLUCOPHAGE) 850 MG tablet Take 850 mg by mouth 2 (two) times daily with a meal.   Yes Historical Provider, MD  azithromycin (ZITHROMAX) 250 MG tablet Take 1 tablet (250 mg total) by mouth daily. Take first 2 tablets together, then 1 every day until finished. 09/10/15   Vanetta MuldersScott Zackowski, MD  dextromethorphan-guaiFENesin (MUCINEX DM) 30-600 MG 12hr tablet Take 1 tablet by mouth 2 (two) times daily. 09/10/15   Vanetta MuldersScott Zackowski, MD  diphenhydrAMINE (SOMINEX) 25 MG tablet Take 25 mg by mouth at bedtime as needed for sleep.    Historical Provider, MD  HYDROcodone-acetaminophen (NORCO/VICODIN) 5-325 MG tablet Take 1-2 tablets by mouth every 6 (six) hours as needed for moderate pain. 10/18/15   Vanetta MuldersScott Zackowski, MD  Insulin Aspart (NOVOLOG [place]) Inject into the skin.    Historical Provider, MD  Levothyroxine Sodium (  SYNTHROID PO) Take by mouth.    Historical Provider, MD  metFORMIN (GLUCOPHAGE) 500 MG tablet Take 500 mg by mouth 2 (two) times daily with a meal.    Historical Provider, MD  pantoprazole (PROTONIX) 20 MG tablet Take 40 mg by mouth daily.    Historical Provider, MD  pravastatin (PRAVACHOL) 20 MG tablet Take 20 mg by mouth daily.    Historical Provider, MD  predniSONE (DELTASONE) 10 MG tablet Take 4 tablets (40 mg total) by mouth  daily. 10/18/15   Vanetta Mulders, MD  sodium-potassium bicarbonate (ALKA-SELTZER GOLD) TBEF dissolvable tablet Take 1 tablet by mouth daily as needed.    Historical Provider, MD   BP 138/94 mmHg  Pulse 98  Temp(Src) 98.9 F (37.2 C) (Oral)  Resp 18  Ht 5' 4.5" (1.638 m)  Wt 167 lb (75.751 kg)  BMI 28.23 kg/m2  SpO2 99%  LMP 02/02/2016 Physical Exam  Constitutional: She is oriented to person, place, and time. She appears well-developed and well-nourished. No distress.  HENT:  Head: Normocephalic and atraumatic.  Eyes: EOM are normal. Pupils are equal, round, and reactive to light.  Neck: Normal range of motion. Neck supple.  Cardiovascular: Normal rate and regular rhythm.  Exam reveals no gallop and no friction rub.   No murmur heard. Pulmonary/Chest: Effort normal. She has no wheezes. She has no rales.  Abdominal: Soft. She exhibits no distension. There is no tenderness.  Musculoskeletal: She exhibits tenderness. She exhibits no edema.  patient points to area of tenderness along the ischium on the left. Pulse motor and sensation is intact distally.  Neurological: She is alert and oriented to person, place, and time.  Skin: Skin is warm and dry. She is not diaphoretic.  Psychiatric: She has a normal mood and affect. Her behavior is normal.  Nursing note and vitals reviewed.   ED Course  Procedures (including critical care time) Labs Review Labs Reviewed - No data to display  Imaging Review No results found. I have personally reviewed and evaluated these images and lab results as part of my medical decision-making.   EKG Interpretation None      MDM   Final diagnoses:  Sciatica of left side    40 yo F with sciatica. No red flags. Treat pain here discharge home.  11:27 AM:  I have discussed the diagnosis/risks/treatment options with the patient and family and believe the pt to be eligible for discharge home to follow-up with PCP. We also discussed returning to the ED  immediately if new or worsening sx occur. We discussed the sx which are most concerning (e.g., sudden worsening pain, fever, cauda equina s/sx) that necessitate immediate return. Medications administered to the patient during their visit and any new prescriptions provided to the patient are listed below.  Medications given during this visit Medications  ketorolac (TORADOL) injection 30 mg (not administered)  acetaminophen (TYLENOL) tablet 1,000 mg (not administered)  oxyCODONE (Oxy IR/ROXICODONE) immediate release tablet 5 mg (not administered)  diazepam (VALIUM) tablet 5 mg (not administered)    New Prescriptions   No medications on file    The patient appears reasonably screen and/or stabilized for discharge and I doubt any other medical condition or other [facility] requiring further screening, evaluation, or treatment in the ED at this time prior to discharge.      Melene Plan, DO 02/17/16 1127

## 2016-04-08 DIAGNOSIS — R12 Heartburn: Secondary | ICD-10-CM | POA: Insufficient documentation

## 2016-04-26 ENCOUNTER — Encounter (HOSPITAL_BASED_OUTPATIENT_CLINIC_OR_DEPARTMENT_OTHER): Payer: Self-pay | Admitting: Emergency Medicine

## 2016-04-26 ENCOUNTER — Emergency Department (HOSPITAL_BASED_OUTPATIENT_CLINIC_OR_DEPARTMENT_OTHER)
Admission: EM | Admit: 2016-04-26 | Discharge: 2016-04-26 | Disposition: A | Discharge status: Home / Self Care | Payer: 59 | Attending: Emergency Medicine | Admitting: Emergency Medicine

## 2016-04-26 DIAGNOSIS — R200 Anesthesia of skin: Secondary | ICD-10-CM | POA: Diagnosis not present

## 2016-04-26 DIAGNOSIS — Z794 Long term (current) use of insulin: Secondary | ICD-10-CM | POA: Diagnosis not present

## 2016-04-26 DIAGNOSIS — M545 Low back pain: Secondary | ICD-10-CM | POA: Diagnosis present

## 2016-04-26 DIAGNOSIS — Z7984 Long term (current) use of oral hypoglycemic drugs: Secondary | ICD-10-CM | POA: Insufficient documentation

## 2016-04-26 DIAGNOSIS — E119 Type 2 diabetes mellitus without complications: Secondary | ICD-10-CM | POA: Diagnosis not present

## 2016-04-26 DIAGNOSIS — M5442 Lumbago with sciatica, left side: Secondary | ICD-10-CM | POA: Insufficient documentation

## 2016-04-26 DIAGNOSIS — Z87891 Personal history of nicotine dependence: Secondary | ICD-10-CM | POA: Insufficient documentation

## 2016-04-26 LAB — URINE MICROSCOPIC-ADD ON

## 2016-04-26 LAB — URINALYSIS, ROUTINE W REFLEX MICROSCOPIC
BILIRUBIN URINE: NEGATIVE
Glucose, UA: 500 mg/dL — AB
Ketones, ur: 15 mg/dL — AB
Leukocytes, UA: NEGATIVE
NITRITE: NEGATIVE
PROTEIN: NEGATIVE mg/dL
Specific Gravity, Urine: 1.008 (ref 1.005–1.030)
pH: 6 (ref 5.0–8.0)

## 2016-04-26 LAB — PREGNANCY, URINE: PREG TEST UR: NEGATIVE

## 2016-04-26 LAB — CBG MONITORING, ED: Glucose-Capillary: 315 mg/dL — ABNORMAL HIGH (ref 65–99)

## 2016-04-26 MED ORDER — CYCLOBENZAPRINE HCL 10 MG PO TABS: 10.0000 mg | ORAL_TABLET | Freq: Two times a day (BID) | ORAL | 0 refills | Status: DC | PRN

## 2016-04-26 MED ORDER — DIPHENHYDRAMINE HCL 25 MG PO CAPS
25.0000 mg | ORAL_CAPSULE | Freq: Once | ORAL | Status: AC
  Administered 2016-04-26: 25 mg via ORAL
  Filled 2016-04-26: qty 1

## 2016-04-26 MED ORDER — KETOROLAC TROMETHAMINE 30 MG/ML IJ SOLN
30.0000 mg | Freq: Once | INTRAMUSCULAR | Status: AC
  Administered 2016-04-26: 30 mg via INTRAMUSCULAR
  Filled 2016-04-26: qty 1

## 2016-04-26 MED ORDER — HYDROCODONE-ACETAMINOPHEN 5-325 MG PO TABS
1.0000 | ORAL_TABLET | Freq: Once | ORAL | Status: AC
  Administered 2016-04-26: 1 via ORAL
  Filled 2016-04-26: qty 1

## 2016-04-26 NOTE — Discharge Instructions (Signed)
Take the flexeril as needed for muscle spasm. Perform stretches. Follow up with primary care or orthopaedics this week. Have included referral to primary care and orthopedist. Return to the ED if he develop fever, lose control of urine or bowel or bladder, developed numbness in her groin, or your blood sugars are elevated. You need to also follow up with your endocrinologist for recheck of diabetes.

## 2016-04-26 NOTE — ED Provider Notes (Signed)
Medical screening examination/treatment/procedure(s) were conducted as a shared visit with non-physician practitioner(s) and myself.  I personally evaluated the patient during the encounter.  40 yo F w/ back pain, worse with movement. Better with rest. Radiates down leg with some paresthesias similar to previous sciatic flares On exam has mild ttp to paraspinals. Some worsening with leg movement. No neurologic changes, no midline ttp. No rash.  Labs without UTI or pregnancy. Did have some ketones on her urinalysis, but she hasn't eaten in >16 hours and not eaten as much over last few days, so likely related to that, doubt it is DKA as VS WNL, she is type II.  S/s likely c/w sciatica vs muscular. Will treat appropriately and once again encourage PCP follow up.    Marily MemosJason Davaun Quintela, MD 04/28/16 [phone]

## 2016-04-26 NOTE — ED Triage Notes (Signed)
Low back pain since Friday with hx of same. Pain is radiating down L leg and causing some numbness.

## 2016-04-27 NOTE — ED Provider Notes (Signed)
[facility]-EMERGENCY DEPT Provider Note   CSN: [id number] Arrival date & time: 04/26/16  8295     History   Chief Complaint Chief Complaint  Patient presents with  . Back Pain    HPI Shawna Coleman is a 40 y.o. female.  40 yo AA female with pmh significant for DM type II presents to the ED today with left lower back pain, left hip, and pain that radiates down the left leg. Patient with history of same and diagnosed with sciatica. The pain has been increasing for the past 2 days. Patient has history of lumbar steroid injection. Patient was seen in ED in July for same. Encouraged to follow up with pcp. She has not done so. States the pain feels like sciatic pain. Endorses mild numbness/tingling of left leg. Walking makes the pain worse. She has tried nothing for the pain. Rest makes the pain better. Denies any fever, chills, ha, vision changes, cp, sob, n/v/d, abd pain, urinary symptom, loss of bowel or bladder, urinary retention, saddle paresthesia, history of IVDA or cancer,or recent trauma.        Past Medical History:  Diagnosis Date  . Diabetes mellitus without complication (HCC)   . High cholesterol     There are no active problems to display for this patient.   Past Surgical History:  Procedure Laterality Date  . ECTOPIC PREGNANCY SURGERY    . TUBAL LIGATION      OB History    No data available       Home Medications    Prior to Admission medications   Medication Sig Start Date End Date Taking? Authorizing Provider  azithromycin (ZITHROMAX) 250 MG tablet Take 1 tablet (250 mg total) by mouth daily. Take first 2 tablets together, then 1 every day until finished. 09/10/15   Vanetta Mulders, MD  cyclobenzaprine (FLEXERIL) 10 MG tablet Take 1 tablet (10 mg total) by mouth 2 (two) times daily as needed for muscle spasms. 04/26/16   Rise Mu, PA-C  dextromethorphan-guaiFENesin (MUCINEX DM) 30-600 MG 12hr tablet Take 1 tablet by mouth 2 (two) times daily. 09/10/15    Vanetta Mulders, MD  diphenhydrAMINE (SOMINEX) 25 MG tablet Take 25 mg by mouth at bedtime as needed for sleep.    Historical Provider, MD  glipiZIDE (GLUCOTROL) 10 MG tablet Take 10 mg by mouth 2 (two) times daily before a meal.    Historical Provider, MD  HYDROcodone-acetaminophen (NORCO/VICODIN) 5-325 MG tablet Take 1-2 tablets by mouth every 6 (six) hours as needed for moderate pain. 10/18/15   Vanetta Mulders, MD  Insulin Aspart (NOVOLOG ) Inject into the skin.    Historical Provider, MD  insulin lispro protamine-lispro (HUMALOG 75/25 MIX) (75-25) 100 UNIT/ML SUSP injection Inject 50 Units into the skin 2 (two) times daily with a meal. 50 units AM, 55 units PM    Historical Provider, MD  Levothyroxine Sodium (SYNTHROID PO) Take by mouth.    Historical Provider, MD  metFORMIN (GLUCOPHAGE) 500 MG tablet Take 500 mg by mouth 2 (two) times daily with a meal.    Historical Provider, MD  metFORMIN (GLUCOPHAGE) 850 MG tablet Take 850 mg by mouth 2 (two) times daily with a meal.    Historical Provider, MD  pantoprazole (PROTONIX) 20 MG tablet Take 40 mg by mouth daily.    Historical Provider, MD  pravastatin (PRAVACHOL) 20 MG tablet Take 20 mg by mouth daily.    Historical Provider, MD  predniSONE (DELTASONE) 10 MG tablet Take 4 tablets (40  mg total) by mouth daily. 10/18/15   Vanetta Mulders, MD  sodium-potassium bicarbonate (ALKA-SELTZER GOLD) TBEF dissolvable tablet Take 1 tablet by mouth daily as needed.    Historical Provider, MD    Family History No family history on file.  Social History Social History  Substance Use Topics  . Smoking status: Former Games developer  . Smokeless tobacco: Never Used  . Alcohol use Yes     Comment: 2x week     Allergies   Etodolac; Macrolides and ketolides; Meloxicam; and Oxycontin [oxycodone hcl]   Review of Systems Review of Systems  Constitutional: Negative for chills, fatigue and fever.  HENT: Negative for congestion and rhinorrhea.   Eyes: Negative  for pain.  Respiratory: Negative for cough and shortness of breath.   Cardiovascular: Negative for chest pain, palpitations and leg swelling.  Gastrointestinal: Negative for abdominal pain, diarrhea, nausea and vomiting.  Genitourinary: Negative for dysuria, frequency, hematuria and urgency.  Musculoskeletal: Positive for back pain. Negative for gait problem.  Skin: Negative for rash.  Neurological: Positive for numbness. Negative for dizziness, syncope, weakness, light-headedness and headaches.     Physical Exam Updated Vital Signs BP 140/82 (BP Location: Right Arm)   Pulse 72   Temp 98.5 F (36.9 C) (Oral)   Resp 18   Ht 5' 4.5" (1.638 m)   Wt 73.5 kg   LMP 04/23/2016   SpO2 100%   BMI 27.38 kg/m   Physical Exam  Constitutional: She appears well-developed and well-nourished. No distress.  HENT:  Head: Normocephalic and atraumatic.  Mouth/Throat: Oropharynx is clear and moist.  Eyes: Conjunctivae are normal. Pupils are equal, round, and reactive to light. Right eye exhibits no discharge. Left eye exhibits no discharge. No scleral icterus.  Neck: Normal range of motion. Neck supple. No thyromegaly present.  Cardiovascular: Normal rate, regular rhythm, normal heart sounds and intact distal pulses.   Pulmonary/Chest: Effort normal and breath sounds normal.  Abdominal: Soft. Bowel sounds are normal. She exhibits no distension. There is no tenderness. There is no rebound, no guarding and no CVA tenderness.  Musculoskeletal: Normal range of motion. She exhibits tenderness. She exhibits no edema.  TTP overt the left SI joint that radiates into her right buttocks and lateral left thigh. She has some left L-spine paraspinous tenderness. No midline tenderness. No CVA tenderness. No edema appreciated. DP pulses 2+ bilaterally. Sensation intact. Moving all extremities. Strength 5/5 in all extremities. Cap refill normal. Positive straight leg test on the left.  Lymphadenopathy:    She has  no cervical adenopathy.  Neurological: She is alert. She has normal strength and normal reflexes. No sensory deficit.  Skin: Skin is warm and dry. Capillary refill takes less than 2 seconds.  Vitals reviewed.    ED Treatments / Results  Labs (all labs ordered are listed, but only abnormal results are displayed) Labs Reviewed  URINALYSIS, ROUTINE W REFLEX MICROSCOPIC (NOT AT [facility]) - Abnormal; Notable for the following:       Result Value   Glucose, UA 500 (*)    Hgb urine dipstick MODERATE (*)    Ketones, ur 15 (*)    All other components within normal limits  URINE MICROSCOPIC-ADD ON - Abnormal; Notable for the following:    Squamous Epithelial / LPF 0-5 (*)    Bacteria, UA RARE (*)    All other components within normal limits  CBG MONITORING, ED - Abnormal; Notable for the following:    Glucose-Capillary 315 (*)    All other  components within normal limits  PREGNANCY, URINE    EKG  EKG Interpretation None       Radiology No results found.  Procedures Procedures (including critical care time)  Medications Ordered in ED Medications  ketorolac (TORADOL) 30 MG/ML injection 30 mg (30 mg Intramuscular Given 04/26/16 1240)  HYDROcodone-acetaminophen (NORCO/VICODIN) 5-325 MG per tablet 1 tablet (1 tablet Oral Given 04/26/16 1240)  diphenhydrAMINE (BENADRYL) capsule 25 mg (25 mg Oral Given 04/26/16 1240)     Initial Impression / Assessment and Plan / ED Course  I have reviewed the triage vital signs and the nursing notes.  Pertinent labs & imaging results that were available during my care of the patient were reviewed by me and considered in my medical decision making (see chart for details).  Clinical Course  Patient presents to the ED with sciatic pain. History of same. Has not been able to follow up with PCP. Exam is unremarkable. No focal neurological deficits. Patient does not have midline tenderness or recent trauma and no imaging is indicated at this time. UA showed  blood, glucose, and ketone. Patient is currently on her menstrual cycle which can account for the blood. SHe is a type II DM who is on insulin. She states her sugars this morning was 218. CBG was 315 but taken after patient had graham crackers and ginger ale. Patient states she has not eaten in the past 12 hours due to the pain. And has had a pour appetite yesterday due to pain. Ketone likely due to poor intake. I doubt DKA because cbg 300 and vs are wnl. Pain is likely sciatic in nature. Given pain medicine in ED. Dicussed with patient that I do not want to send her home on steroids due to her being a diabetic. Patient encouraged to follow up with primary care for further eval and treatment. Prescription for benadryl given. Patient verbalized understanding with plan of care. Discussed plan of care with DR. Mesner how saw and examined patient and agrees. Strict return precautions given. Hemodynamically stable. Discharged home in NAD with stable VS.    Final Clinical Impressions(s) / ED Diagnoses   Final diagnoses:  Left-sided low back pain with left-sided sciatica    New Prescriptions Discharge Medication List as of 04/26/2016  1:55 PM    START taking these medications   Details  cyclobenzaprine (FLEXERIL) 10 MG tablet Take 1 tablet (10 mg total) by mouth 2 (two) times daily as needed for muscle spasms., Starting Sun 04/26/2016, Print         Rise MuKenneth T Chelan Heringer, PA-C 04/28/16 0915    Marily MemosJason Mesner, MD 04/29/16 2136

## 2016-05-14 ENCOUNTER — Ambulatory Visit: Payer: No Typology Code available for payment source | Admitting: Family Medicine

## 2016-05-21 ENCOUNTER — Ambulatory Visit: Payer: No Typology Code available for payment source | Admitting: Family Medicine

## 2016-05-29 ENCOUNTER — Encounter: Payer: Self-pay | Admitting: Family Medicine

## 2016-05-29 ENCOUNTER — Ambulatory Visit (INDEPENDENT_AMBULATORY_CARE_PROVIDER_SITE_OTHER): Payer: 59 | Admitting: Family Medicine

## 2016-05-29 DIAGNOSIS — M25552 Pain in left hip: Secondary | ICD-10-CM | POA: Diagnosis not present

## 2016-05-29 NOTE — Patient Instructions (Signed)
You have piriformis syndrome Try to avoid painful activities when possible. Consider tennis ball to massage area when sitting Pick 2-3 stretches where you feel the pull in the area of pain - do 3 of these and hold for 20-30 seconds at least once a day Ibuprofen 600mg  three times a day with food OR aleve 2 tabs twice a day with food for pain and inflammation as needed. Hip side raises and standing hip rotations 3 sets of 10 once a day - add ankle weight if these become too easy. Consider physical therapy Follow up with me in 5-6 weeks.

## 2016-06-02 DIAGNOSIS — M25552 Pain in left hip: Secondary | ICD-10-CM | POA: Insufficient documentation

## 2016-06-02 DIAGNOSIS — I10 Essential (primary) hypertension: Secondary | ICD-10-CM | POA: Insufficient documentation

## 2016-06-02 NOTE — Assessment & Plan Note (Signed)
2/2 piriformis syndrome.  Shown home exercises and stretches to do daily.  Tennis ball massage.  Ibuprofen or aleve if needed.  Consider physical therapy.  F/u in 5-6 weeks.

## 2016-06-02 NOTE — Progress Notes (Signed)
PCP: PATEL,DHAVAL, MD  Subjective:   HPI: Patient is a 40 y.o. female here for left low back pain.  Patient reports for about 1 year she's had intermittent problems where she gets radiation from left buttock down into left foot. Associated numbness into left foot. Tried vicodin, aspirin, muscle relaxant, heating pad. Pain 10/10 and sharp at worst with episodes. No bowel/bladder dysfunction.  Past Medical History:  Diagnosis Date  . Diabetes mellitus without complication (HCC)   . High cholesterol     Current Outpatient Prescriptions on File Prior to Visit  Medication Sig Dispense Refill  . cyclobenzaprine (FLEXERIL) 10 MG tablet Take 1 tablet (10 mg total) by mouth 2 (two) times daily as needed for muscle spasms. 20 tablet 0  . Insulin Aspart (NOVOLOG [place]) Inject into the skin.    Marland Kitchen. insulin lispro protamine-lispro (HUMALOG 75/25 MIX) (75-25) 100 UNIT/ML SUSP injection Inject 50 Units into the skin 2 (two) times daily with a meal. 50 units AM, 55 units PM    . pantoprazole (PROTONIX) 20 MG tablet Take 40 mg by mouth daily.     No current facility-administered medications on file prior to visit.     Past Surgical History:  Procedure Laterality Date  . ECTOPIC PREGNANCY SURGERY    . TUBAL LIGATION      Allergies  Allergen Reactions  . Etodolac     Unable to urinate  . Macrolides And Ketolides   . Meloxicam   . Oxycontin [Oxycodone Hcl] Itching and Rash    Social History   Social History  . Marital status: Single    Spouse name: N/A  . Number of children: N/A  . Years of education: N/A   Occupational History  . Not on file.   Social History Main Topics  . Smoking status: Former Games developermoker  . Smokeless tobacco: Never Used  . Alcohol use Yes     Comment: 2x week  . Drug use: No  . Sexual activity: Not on file   Other Topics Concern  . Not on file   Social History Narrative  . No narrative on file    No family history on file.  BP 119/84   Pulse 83   Ht  5\' 5"  (1.651 m)   Wt 165 lb (74.8 kg)   BMI 27.46 kg/m   Review of Systems: See HPI above.    Objective:  Physical Exam:  Gen: NAD, comfortable in exam room  Back: No gross deformity, scoliosis. TTP over left piriformis.  No paraspinal, other tenderness of back or left hip.  No midline or bony TTP. FROM. Strength LEs 5/5 all muscle groups.   2+ MSRs in patellar and achilles tendons, equal bilaterally. Negative SLRs. Sensation intact to light touch bilaterally. Negative logroll bilateral hips Mild positive piriformis on left, negative right. Negative fabers.    Assessment & Plan:  1. Left hip pain - 2/2 piriformis syndrome.  Shown home exercises and stretches to do daily.  Tennis ball massage.  Ibuprofen or aleve if needed.  Consider physical therapy.  F/u in 5-6 weeks.

## 2016-06-19 ENCOUNTER — Emergency Department (HOSPITAL_BASED_OUTPATIENT_CLINIC_OR_DEPARTMENT_OTHER)
Admission: EM | Admit: 2016-06-19 | Discharge: 2016-06-19 | Disposition: A | Discharge status: Home / Self Care | Payer: 59 | Attending: Emergency Medicine | Admitting: Emergency Medicine

## 2016-06-19 ENCOUNTER — Encounter (HOSPITAL_BASED_OUTPATIENT_CLINIC_OR_DEPARTMENT_OTHER): Payer: Self-pay

## 2016-06-19 DIAGNOSIS — R21 Rash and other nonspecific skin eruption: Secondary | ICD-10-CM | POA: Insufficient documentation

## 2016-06-19 DIAGNOSIS — E119 Type 2 diabetes mellitus without complications: Secondary | ICD-10-CM | POA: Diagnosis not present

## 2016-06-19 DIAGNOSIS — Y939 Activity, unspecified: Secondary | ICD-10-CM | POA: Diagnosis not present

## 2016-06-19 DIAGNOSIS — W57XXXA Bitten or stung by nonvenomous insect and other nonvenomous arthropods, initial encounter: Secondary | ICD-10-CM | POA: Insufficient documentation

## 2016-06-19 DIAGNOSIS — Y929 Unspecified place or not applicable: Secondary | ICD-10-CM | POA: Diagnosis not present

## 2016-06-19 DIAGNOSIS — Z79899 Other long term (current) drug therapy: Secondary | ICD-10-CM | POA: Diagnosis not present

## 2016-06-19 DIAGNOSIS — Y999 Unspecified external cause status: Secondary | ICD-10-CM | POA: Insufficient documentation

## 2016-06-19 DIAGNOSIS — Z794 Long term (current) use of insulin: Secondary | ICD-10-CM | POA: Insufficient documentation

## 2016-06-19 DIAGNOSIS — Z87891 Personal history of nicotine dependence: Secondary | ICD-10-CM | POA: Insufficient documentation

## 2016-06-19 MED ORDER — HYDROCORTISONE 1 % EX CREA
TOPICAL_CREAM | Freq: Two times a day (BID) | CUTANEOUS | Status: DC
  Administered 2016-06-19: 1 via TOPICAL
  Filled 2016-06-19: qty 28

## 2016-06-19 MED ORDER — CETIRIZINE HCL 10 MG PO TABS: ORAL_TABLET | ORAL | Status: DC

## 2016-06-19 MED ORDER — HYDROXYZINE HCL 25 MG PO TABS
25.0000 mg | ORAL_TABLET | Freq: Once | ORAL | Status: AC
  Administered 2016-06-19: 25 mg via ORAL
  Filled 2016-06-19: qty 1

## 2016-06-19 MED ORDER — HYDROXYZINE HCL 25 MG PO TABS: ORAL_TABLET | ORAL | 0 refills | Status: DC

## 2016-06-19 NOTE — ED Notes (Signed)
ED Provider at bedside. 

## 2016-06-19 NOTE — ED Triage Notes (Signed)
Pt c/o rash ? Insects bites to all extr. X 2 days

## 2016-06-19 NOTE — ED Notes (Signed)
Pt verbalizes understanding of d/c instructions and denies any further needs at this time. 

## 2016-06-19 NOTE — ED Provider Notes (Signed)
[facility] Provider Note: [name], MD, FACEP  CSN: [id number] MRN: [id number] ARRIVAL: 06/19/16 at 0017 ROOM: [facility]   CHIEF COMPLAINT  Rash   HISTORY OF PRESENT ILLNESS  Shawna Coleman is a 40 y.o. female with a two-day history of a pruritic rash which she believes are due to insect bites. The rash consists of scattered erythematous maculopapular lesions. She has been taking Benadryl without relief. She denies difficulty breathing or symptoms suggestive of an acute viral illness. She has not actually seen any insects on her skin. She also has a sensation that her skin is crawling.   Past Medical History:  Diagnosis Date  . Diabetes mellitus without complication (HCC)   . High cholesterol     Past Surgical History:  Procedure Laterality Date  . ECTOPIC PREGNANCY SURGERY    . TUBAL LIGATION      History reviewed. No pertinent family history.  Social History  Substance Use Topics  . Smoking status: Former Games developermoker  . Smokeless tobacco: Never Used  . Alcohol use Yes     Comment: 2x week    Prior to Admission medications   Medication Sig Start Date End Date Taking? Authorizing Provider  esomeprazole (NEXIUM) 40 MG capsule Take 40 mg by mouth daily at 12 noon.   Yes Historical Provider, MD  cetirizine (ZYRTEC) 10 MG tablet Take one tablet every morning as needed for itching. 06/19/16   Stoney Karczewski, MD  cyclobenzaprine (FLEXERIL) 10 MG tablet Take 1 tablet (10 mg total) by mouth 2 (two) times daily as needed for muscle spasms. 04/26/16   Rise MuKenneth T Leaphart, PA-C  glipiZIDE (GLUCOTROL XL) 10 MG 24 hr tablet Take by mouth.    Historical Provider, MD  hydrOXYzine (ATARAX/VISTARIL) 25 MG tablet Take 1-2 tablets at bedtime as needed for itching. May cause drowsiness. 06/19/16   Karisa Nesser, MD  Insulin Aspart (NOVOLOG [place]) Inject into the skin.    Historical Provider, MD  insulin lispro protamine-lispro (HUMALOG 75/25 MIX) (75-25) 100 UNIT/ML SUSP injection  Inject 50 Units into the skin 2 (two) times daily with a meal. 50 units AM, 55 units PM    Historical Provider, MD  levothyroxine (SYNTHROID, LEVOTHROID) 100 MCG tablet Take by mouth.    Historical Provider, MD  metFORMIN (GLUCOPHAGE) 850 MG tablet Take by mouth.    Historical Provider, MD  pravastatin (PRAVACHOL) 20 MG tablet Take by mouth.    Historical Provider, MD    Allergies Etodolac; Macrolides and ketolides; Meloxicam; and Oxycontin [oxycodone hcl]   REVIEW OF SYSTEMS  Negative except as noted here or in the History of Present Illness.   PHYSICAL EXAMINATION  Initial Vital Signs Blood pressure 153/100, pulse 101, temperature 98.8 F (37.1 C), resp. rate 16, last menstrual period 05/26/2016, SpO2 100 %.  Examination General: Well-developed, well-nourished female in no acute distress; appearance consistent with age of record HENT: normocephalic; atraumatic Eyes: pupils equal, round and reactive to light; extraocular muscles intact Neck: supple Heart: regular rate and rhythm Lungs: clear to auscultation bilaterally Abdomen: soft; nondistended; nontender; bowel sounds present Extremities: No deformity; full range of motion; pulses normal Neurologic: Awake, alert and oriented; motor function intact in all extremities and symmetric; no facial droop Skin: Warm and dry; scattered mildly erythematous maculopapular lesions with poorly defined borders Psychiatric: Normal mood and affect   RESULTS  Summary of this visit's results, reviewed by myself:   EKG Interpretation  Date/Time:    Ventricular Rate:    PR Interval:  QRS Duration:   QT Interval:    QTC Calculation:   R Axis:     Text Interpretation:        Laboratory Studies: No results found for this or any previous visit (from the past 24 hour(s)). Imaging Studies: No results found.  ED COURSE  Nursing notes and initial vitals signs, including pulse oximetry, reviewed.  Vitals:   06/19/16 0024  BP:  153/100  Pulse: 101  Resp: 16  Temp: 98.8 F (37.1 C)  SpO2: 100%    PROCEDURES    ED DIAGNOSES     ICD-9-CM ICD-10-CM   1. Insect bite, initial encounter 919.4 [id number]    [id number]         Paula LibraJohn Amiley Shishido, MD 06/19/16 0140

## 2016-06-28 ENCOUNTER — Emergency Department (HOSPITAL_BASED_OUTPATIENT_CLINIC_OR_DEPARTMENT_OTHER)
Admission: EM | Admit: 2016-06-28 | Discharge: 2016-06-28 | Disposition: A | Discharge status: Home / Self Care | Payer: 59 | Attending: Emergency Medicine | Admitting: Emergency Medicine

## 2016-06-28 ENCOUNTER — Encounter (HOSPITAL_BASED_OUTPATIENT_CLINIC_OR_DEPARTMENT_OTHER): Payer: Self-pay | Admitting: Emergency Medicine

## 2016-06-28 DIAGNOSIS — Z794 Long term (current) use of insulin: Secondary | ICD-10-CM | POA: Insufficient documentation

## 2016-06-28 DIAGNOSIS — E119 Type 2 diabetes mellitus without complications: Secondary | ICD-10-CM | POA: Diagnosis not present

## 2016-06-28 DIAGNOSIS — J069 Acute upper respiratory infection, unspecified: Secondary | ICD-10-CM

## 2016-06-28 DIAGNOSIS — J029 Acute pharyngitis, unspecified: Secondary | ICD-10-CM | POA: Diagnosis present

## 2016-06-28 DIAGNOSIS — I10 Essential (primary) hypertension: Secondary | ICD-10-CM | POA: Diagnosis not present

## 2016-06-28 DIAGNOSIS — Z79899 Other long term (current) drug therapy: Secondary | ICD-10-CM | POA: Diagnosis not present

## 2016-06-28 DIAGNOSIS — Z87891 Personal history of nicotine dependence: Secondary | ICD-10-CM | POA: Insufficient documentation

## 2016-06-28 DIAGNOSIS — E039 Hypothyroidism, unspecified: Secondary | ICD-10-CM | POA: Diagnosis not present

## 2016-06-28 MED ORDER — BENZONATATE 100 MG PO CAPS: 100.0000 mg | ORAL_CAPSULE | Freq: Three times a day (TID) | ORAL | 0 refills | Status: DC

## 2016-06-28 NOTE — ED Provider Notes (Signed)
[facility]   CSN: [id number] Arrival date & time: 06/28/16  [id number]     History   Chief Complaint Chief Complaint  Patient presents with  . Sore Throat    HPI Shawna Coleman is a 40 y.o. female.  HPI   Shawna Coleman is a 40 y.o. female, with a history of DM, presenting to the ED with Sore throat, nonproductive cough, bilateral ear discomfort and itching, and nasal congestion for the last 4 days. Patient has tried Alka-Seltzer cold and flu with some relief. Denies fever/chills, N/V/D, shortness of breath, chest pain, abdominal pain, or any other complaints.     Past Medical History:  Diagnosis Date  . Diabetes mellitus without complication (HCC)   . High cholesterol     Patient Active Problem List   Diagnosis Date Noted  . Hypertension 06/02/2016  . Left hip pain 06/02/2016  . Heartburn 04/08/2016  . Mixed hyperlipidemia 11/14/2015  . Uncontrolled type 2 diabetes mellitus without complication, with long-term current use of insulin (HCC) 11/14/2015  . Acquired hypothyroidism 11/14/2015    Past Surgical History:  Procedure Laterality Date  . ECTOPIC PREGNANCY SURGERY    . TUBAL LIGATION      OB History    No data available       Home Medications    Prior to Admission medications   Medication Sig Start Date End Date Taking? Authorizing Provider  benzonatate (TESSALON) 100 MG capsule Take 1 capsule (100 mg total) by mouth every 8 (eight) hours. 06/28/16   Bindu Docter C Lawson Mahone, PA-C  cetirizine (ZYRTEC) 10 MG tablet Take one tablet every morning as needed for itching. 06/19/16   John Molpus, MD  cyclobenzaprine (FLEXERIL) 10 MG tablet Take 1 tablet (10 mg total) by mouth 2 (two) times daily as needed for muscle spasms. 04/26/16   Rise MuKenneth T Leaphart, PA-C  esomeprazole (NEXIUM) 40 MG capsule Take 40 mg by mouth daily at 12 noon.    Historical Provider, MD  glipiZIDE (GLUCOTROL XL) 10 MG 24 hr tablet Take by mouth.    Historical Provider, MD    hydrOXYzine (ATARAX/VISTARIL) 25 MG tablet Take 1-2 tablets at bedtime as needed for itching. May cause drowsiness. 06/19/16   John Molpus, MD  Insulin Aspart (NOVOLOG [place]) Inject into the skin.    Historical Provider, MD  insulin lispro protamine-lispro (HUMALOG 75/25 MIX) (75-25) 100 UNIT/ML SUSP injection Inject 50 Units into the skin 2 (two) times daily with a meal. 50 units AM, 55 units PM    Historical Provider, MD  levothyroxine (SYNTHROID, LEVOTHROID) 100 MCG tablet Take by mouth.    Historical Provider, MD  metFORMIN (GLUCOPHAGE) 850 MG tablet Take by mouth.    Historical Provider, MD  pravastatin (PRAVACHOL) 20 MG tablet Take by mouth.    Historical Provider, MD    Family History No family history on file.  Social History Social History  Substance Use Topics  . Smoking status: Former Games developermoker  . Smokeless tobacco: Never Used  . Alcohol use Yes     Comment: 2x week     Allergies   Etodolac; Macrolides and ketolides; Meloxicam; and Oxycontin [oxycodone hcl]   Review of Systems Review of Systems  Constitutional: Negative for chills, diaphoresis and fever.  HENT: Positive for congestion, ear pain and sore throat. Negative for ear discharge, trouble swallowing and voice change.   Respiratory: Positive for cough. Negative for shortness of breath.   Cardiovascular: Negative for chest pain.  Gastrointestinal: Negative for abdominal  pain, diarrhea, nausea and vomiting.  All other systems reviewed and are negative.    Physical Exam Updated Vital Signs BP 139/92   Pulse 81   Temp 98.3 F (36.8 C) (Oral)   Resp 18   Ht 5' 4.5" (1.638 m)   Wt 73.9 kg   LMP 05/26/2016   SpO2 98%   BMI 27.55 kg/m   Physical Exam  Constitutional: She appears well-developed and well-nourished. No distress.  HENT:  Head: Normocephalic and atraumatic.  Mouth/Throat: Uvula is midline. Posterior oropharyngeal erythema present.  Eyes: Conjunctivae are normal.  Neck: Neck supple.   Cardiovascular: Normal rate, regular rhythm, normal heart sounds and intact distal pulses.   Pulmonary/Chest: Effort normal and breath sounds normal. No respiratory distress.  Abdominal: Soft. There is no tenderness. There is no guarding.  Musculoskeletal: She exhibits no edema.  Lymphadenopathy:    She has no cervical adenopathy.  Neurological: She is alert.  Skin: Skin is warm and dry. She is not diaphoretic.  Psychiatric: She has a normal mood and affect. Her behavior is normal.  Nursing note and vitals reviewed.    ED Treatments / Results  Labs (all labs ordered are listed, but only abnormal results are displayed) Labs Reviewed - No data to display  EKG  EKG Interpretation None       Radiology No results found.  Procedures Procedures (including critical care time)  Medications Ordered in ED Medications - No data to display   Initial Impression / Assessment and Plan / ED Course  I have reviewed the triage vital signs and the nursing notes.  Pertinent labs & imaging results that were available during my care of the patient were reviewed by me and considered in my medical decision making (see chart for details).  Clinical Course     Patient presents with symptoms of URI. Patient is well-appearing and has no difficulty breathing or swallowing. The patient was given instructions for home care as well as return precautions. Patient voices understanding of these instructions, accepts the plan, and is comfortable with discharge.  Vitals:   06/28/16 0907 06/28/16 0908  BP: 139/92   Pulse: 81   Resp: 18   Temp: 98.3 F (36.8 C)   TempSrc: Oral   SpO2: 98%   Weight:  73.9 kg  Height:  5' 4.5" (1.638 m)      Final Clinical Impressions(s) / ED Diagnoses   Final diagnoses:  Upper respiratory tract infection, unspecified type    New Prescriptions New Prescriptions   BENZONATATE (TESSALON) 100 MG CAPSULE    Take 1 capsule (100 mg total) by mouth every 8  (eight) hours.     Anselm PancoastShawn C Chelsey Kimberley, PA-C 06/28/16 [id number]    Gwyneth SproutWhitney Plunkett, MD 07/05/16 2035

## 2016-06-28 NOTE — Discharge Instructions (Signed)
Your symptoms are consistent with a viral illness. Viruses do not require antibiotics. Treatment is symptomatic care and it is important to note that these symptoms may last for 7-10 days. Drink plenty of fluids and get plenty of rest. You should be drinking at least a one half to one liter of water an hour to stay hydrated. Ibuprofen, Naproxen, or Tylenol for pain or fever. Tessalon for cough. Plain Mucinex may help relieve congestion. Warm liquids or Chloraseptic spray may help soothe a sore throat. Follow up with a primary care provider, as needed, for any future management of this issue. °

## 2016-06-28 NOTE — ED Triage Notes (Signed)
Pt c/o sore throat, bil ear pain, np cough and "snotty" since Thursday

## 2016-08-05 IMAGING — CT CT ABD-PELV W/ CM
2 of 4 series · 17 of 46 positions shown, 19 images · IV contrast (APPLIED)
Comparison: None.

CLINICAL DATA: Abdominal pain, back pain, bilateral flank pain.
Constipation.

EXAM:
CT ABDOMEN AND PELVIS WITH CONTRAST
TECHNIQUE: Multidetector CT imaging of the abdomen and pelvis was performed
using the standard protocol following bolus administration of
intravenous contrast.
CONTRAST:  100mL OMNIPAQUE IOHEXOL 300 MG/ML SOLN, 25mL OMNIPAQUE
IOHEXOL 300 MG/ML SOLN

[Series 2: abd/pelvis 5.0 b31f · axial · 0.69mm/px · z∈[-428,-13]mm · 14 of 91 slices shown, 16 images]
[im 4/91  soft-tissue]
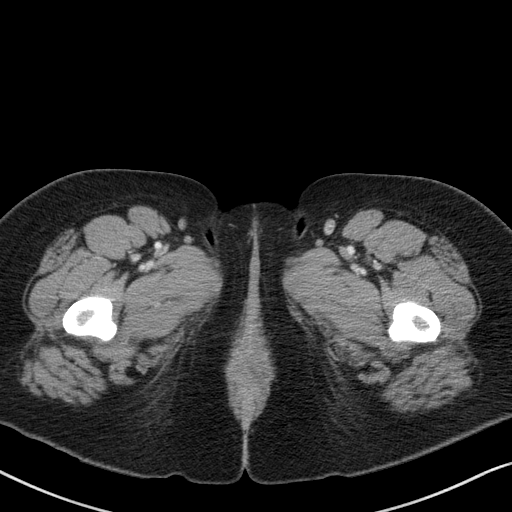
[im 4/91  bone]
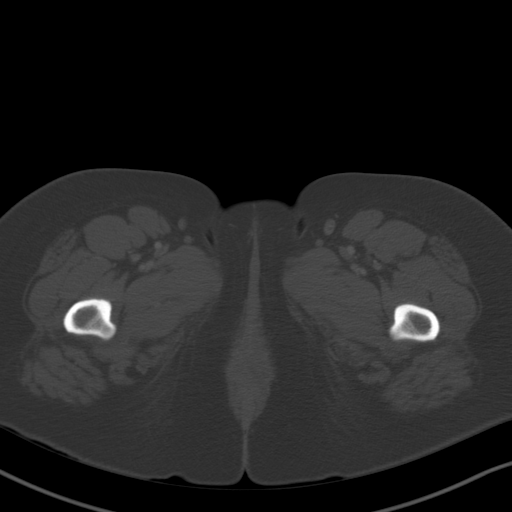
[im 11/91  soft-tissue]
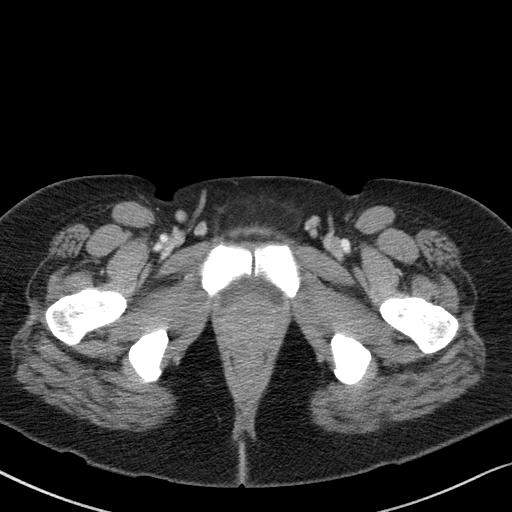
[im 19/91  soft-tissue]
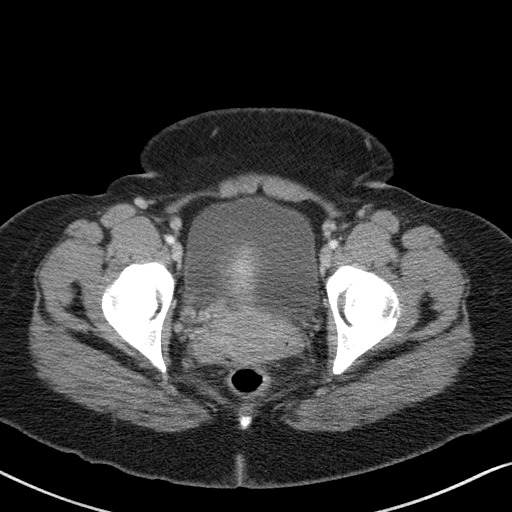
[im 26/91  soft-tissue]
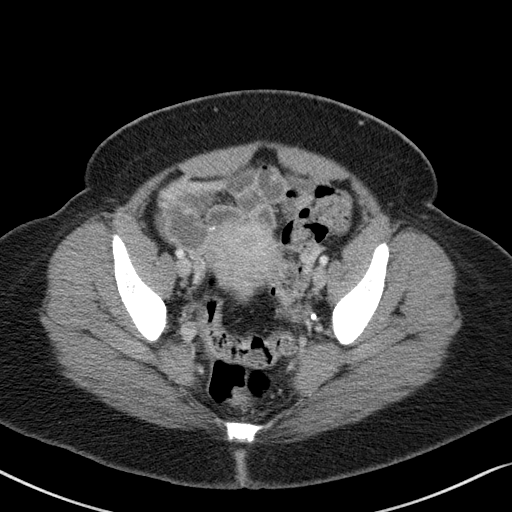
[im 29/91  soft-tissue]
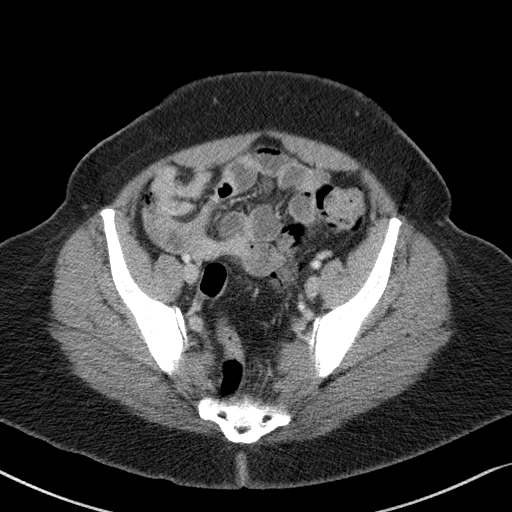
[im 37/91  soft-tissue]
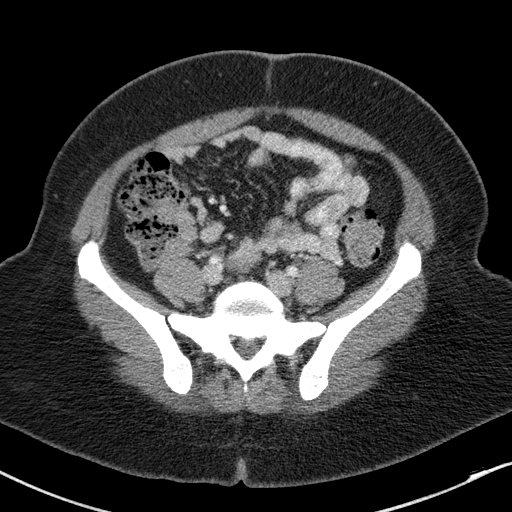
[im 44/91  soft-tissue]
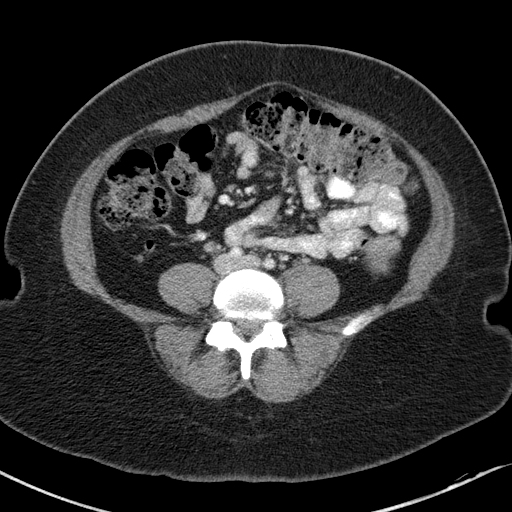
[im 47/91  soft-tissue]
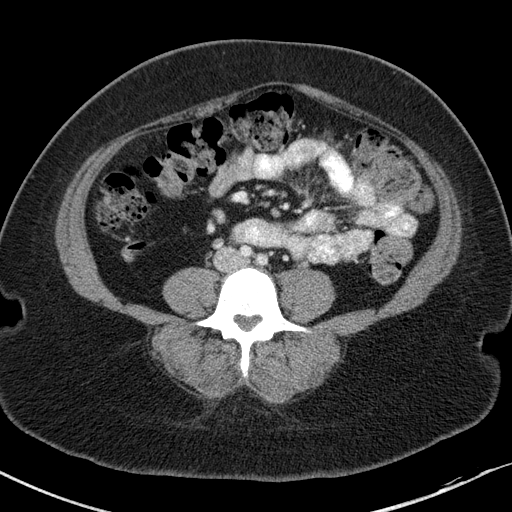
[im 55/91  soft-tissue]
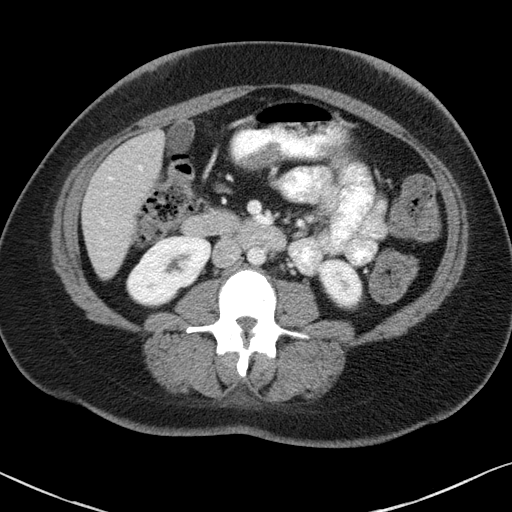
[im 55/91  bone]
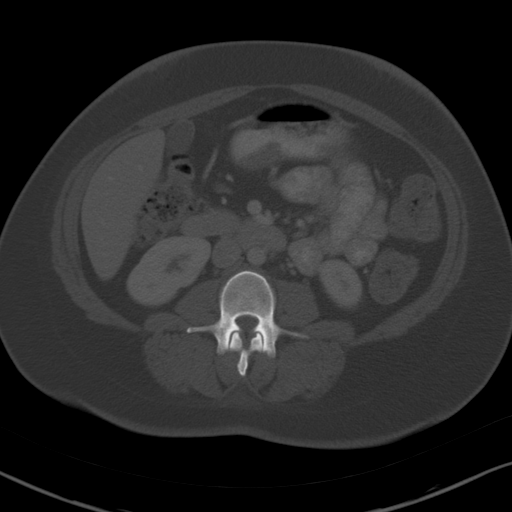
[im 62/91  soft-tissue]
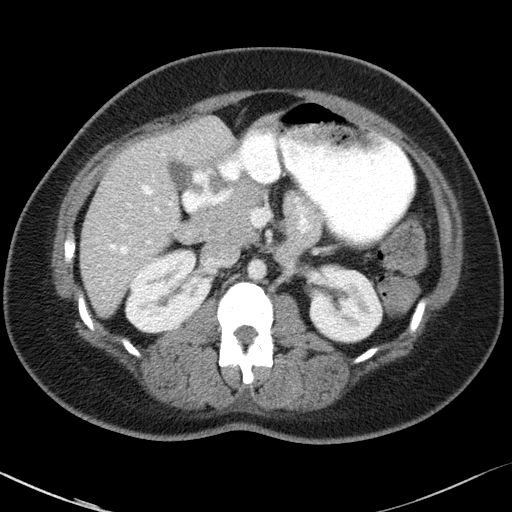
[im 69/91  soft-tissue]
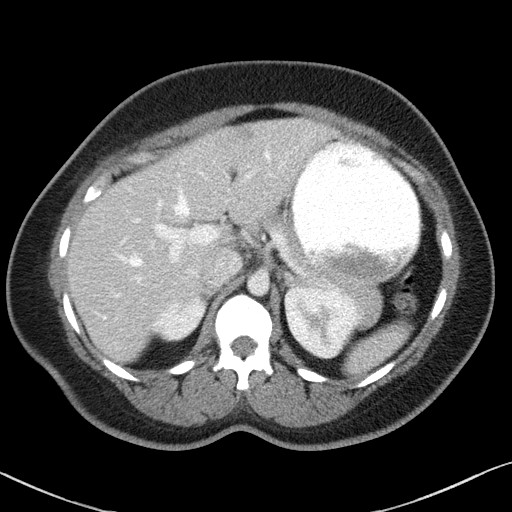
[im 73/91  soft-tissue]
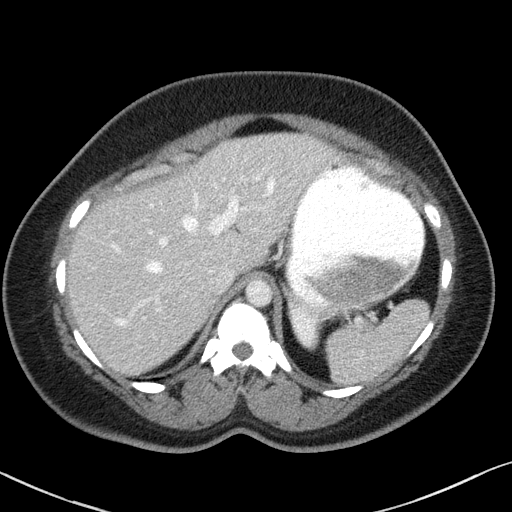
[im 80/91  soft-tissue]
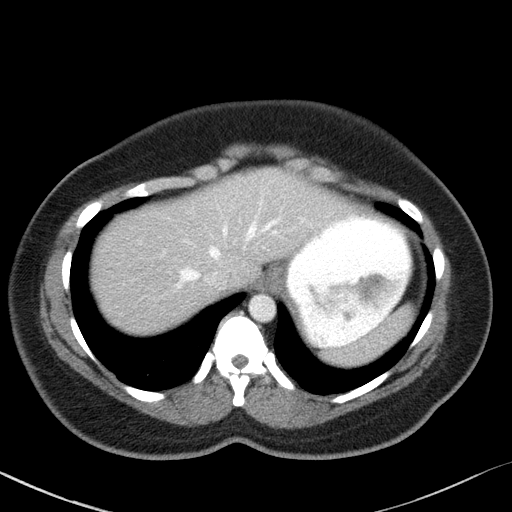
[im 87/91  soft-tissue]
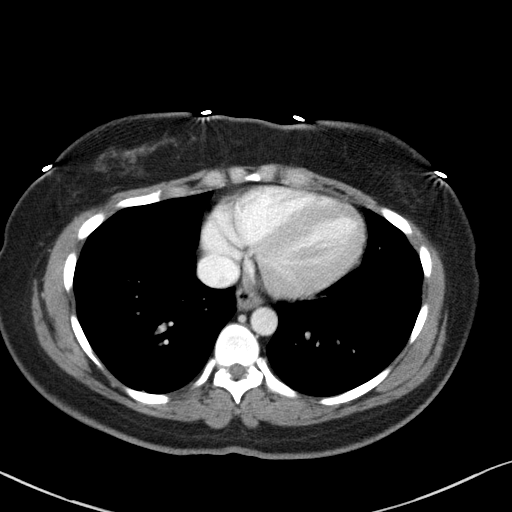

[Series 5: abd/pelvis 3.0 coronal · coronal · 0.78mm/px · 3 of 101 slices shown]
[im 34/101  soft-tissue]
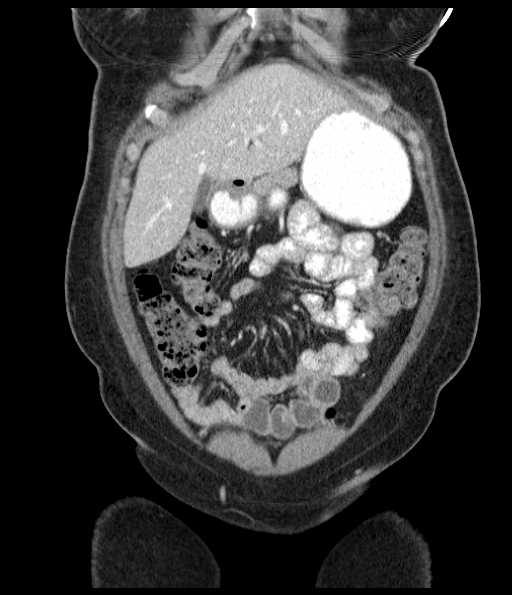
[im 45/101  soft-tissue]
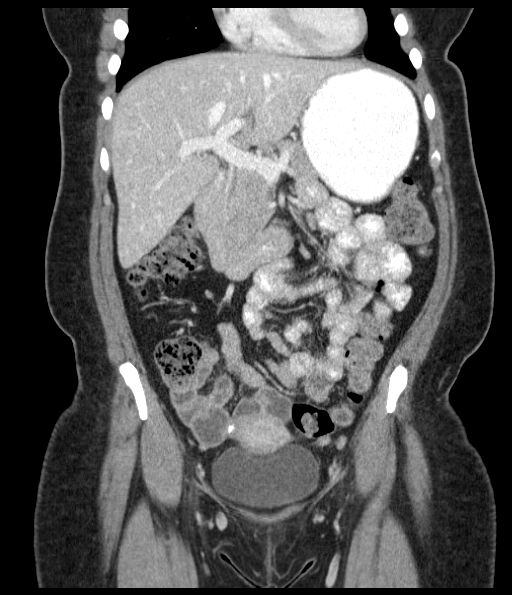
[im 56/101  soft-tissue]
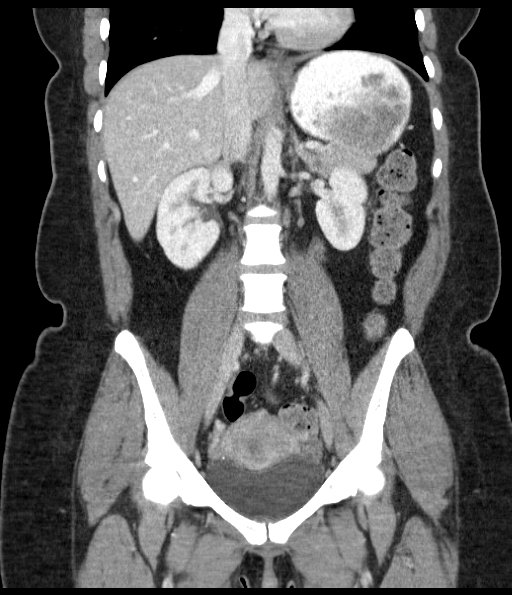

[17 of 46 positions shown; findings below may reference images not displayed]

FINDINGS: Lung bases are clear. No effusions. Heart is normal size.

Liver, spleen, pancreas, adrenals, kidneys, gallbladder
unremarkable.

Appendix is visualized and is normal. Uterus, adnexae and urinary
bladder unremarkable. Stomach, large and small bowel normal.
Moderate stool in the colon. No free fluid, free air or adenopathy.
Aorta is normal caliber.

No acute bony abnormality or focal lesion.
IMPRESSION: Moderate stool burden throughout the colon.

Normal appendix.

No acute findings in the abdomen or pelvis.

## 2016-09-02 ENCOUNTER — Encounter (HOSPITAL_BASED_OUTPATIENT_CLINIC_OR_DEPARTMENT_OTHER): Payer: Self-pay

## 2016-09-02 ENCOUNTER — Emergency Department (HOSPITAL_BASED_OUTPATIENT_CLINIC_OR_DEPARTMENT_OTHER)
Admission: EM | Admit: 2016-09-02 | Discharge: 2016-09-02 | Disposition: A | Discharge status: Home / Self Care | Payer: 59 | Attending: Emergency Medicine | Admitting: Emergency Medicine

## 2016-09-02 ENCOUNTER — Emergency Department (HOSPITAL_BASED_OUTPATIENT_CLINIC_OR_DEPARTMENT_OTHER): Payer: 59

## 2016-09-02 DIAGNOSIS — B9689 Other specified bacterial agents as the cause of diseases classified elsewhere: Secondary | ICD-10-CM

## 2016-09-02 DIAGNOSIS — Z794 Long term (current) use of insulin: Secondary | ICD-10-CM | POA: Insufficient documentation

## 2016-09-02 DIAGNOSIS — N760 Acute vaginitis: Secondary | ICD-10-CM | POA: Insufficient documentation

## 2016-09-02 DIAGNOSIS — Z79899 Other long term (current) drug therapy: Secondary | ICD-10-CM | POA: Insufficient documentation

## 2016-09-02 DIAGNOSIS — E039 Hypothyroidism, unspecified: Secondary | ICD-10-CM | POA: Insufficient documentation

## 2016-09-02 DIAGNOSIS — I10 Essential (primary) hypertension: Secondary | ICD-10-CM | POA: Insufficient documentation

## 2016-09-02 DIAGNOSIS — E119 Type 2 diabetes mellitus without complications: Secondary | ICD-10-CM | POA: Insufficient documentation

## 2016-09-02 DIAGNOSIS — R1030 Lower abdominal pain, unspecified: Secondary | ICD-10-CM

## 2016-09-02 DIAGNOSIS — Z87891 Personal history of nicotine dependence: Secondary | ICD-10-CM | POA: Insufficient documentation

## 2016-09-02 LAB — PREGNANCY, URINE: Preg Test, Ur: NEGATIVE

## 2016-09-02 LAB — CBC WITH DIFFERENTIAL/PLATELET
Basophils Absolute: 0 10*3/uL (ref 0.0–0.1)
Basophils Relative: 0 %
Eosinophils Absolute: 0.1 10*3/uL (ref 0.0–0.7)
Eosinophils Relative: 1 %
HEMATOCRIT: 34.4 % — AB (ref 36.0–46.0)
HEMOGLOBIN: 11.5 g/dL — AB (ref 12.0–15.0)
LYMPHS PCT: 35 %
Lymphs Abs: 2.1 10*3/uL (ref 0.7–4.0)
MCH: 30.6 pg (ref 26.0–34.0)
MCHC: 33.4 g/dL (ref 30.0–36.0)
MCV: 91.5 fL (ref 78.0–100.0)
MONO ABS: 0.3 10*3/uL (ref 0.1–1.0)
MONOS PCT: 6 %
NEUTROS ABS: 3.6 10*3/uL (ref 1.7–7.7)
NEUTROS PCT: 58 %
Platelets: 333 10*3/uL (ref 150–400)
RBC: 3.76 MIL/uL — ABNORMAL LOW (ref 3.87–5.11)
RDW: 13.3 % (ref 11.5–15.5)
WBC: 6.1 10*3/uL (ref 4.0–10.5)

## 2016-09-02 LAB — URINALYSIS, ROUTINE W REFLEX MICROSCOPIC
Bilirubin Urine: NEGATIVE
GLUCOSE, UA: 100 mg/dL — AB
Hgb urine dipstick: NEGATIVE
Ketones, ur: NEGATIVE mg/dL
LEUKOCYTES UA: NEGATIVE
Nitrite: NEGATIVE
PH: 5.5 (ref 5.0–8.0)
Protein, ur: NEGATIVE mg/dL
Specific Gravity, Urine: 1.016 (ref 1.005–1.030)

## 2016-09-02 LAB — WET PREP, GENITAL
SPERM: NONE SEEN
Trich, Wet Prep: NONE SEEN
Yeast Wet Prep HPF POC: NONE SEEN

## 2016-09-02 LAB — OCCULT BLOOD X 1 CARD TO LAB, STOOL: FECAL OCCULT BLD: NEGATIVE

## 2016-09-02 MED ORDER — METRONIDAZOLE 500 MG PO TABS: 500.0000 mg | ORAL_TABLET | Freq: Two times a day (BID) | ORAL | 0 refills | Status: DC

## 2016-09-02 MED ORDER — DOCUSATE SODIUM 100 MG PO CAPS: 100.0000 mg | ORAL_CAPSULE | Freq: Two times a day (BID) | ORAL | 0 refills | Status: DC

## 2016-09-02 MED ORDER — DICYCLOMINE HCL 20 MG PO TABS: 20.0000 mg | ORAL_TABLET | Freq: Two times a day (BID) | ORAL | 0 refills | Status: DC | PRN

## 2016-09-02 NOTE — ED Notes (Signed)
ED Provider at bedside. 

## 2016-09-02 NOTE — ED Notes (Signed)
Patient transported to Ultrasound 

## 2016-09-02 NOTE — ED Notes (Signed)
Patient transported to X-ray 

## 2016-09-02 NOTE — ED Provider Notes (Signed)
[facility] Provider Note   CSN: [id number] Arrival date & time: 09/02/16  1116     History   Chief Complaint Chief Complaint  Patient presents with  . Abdominal Pain    HPI Shawna Coleman is a 41 y.o. female.  HPI She presents with several months of abdominal distention, episodic sharp abdominal pain, and straining with bowel movements. She's had occasional gross blood in stool. She states she's had a clear vaginal discharge but denies any vaginal bleeding. Denies any current abdominal pain. No nausea or vomiting. No fever or chills. Patient also has several months of bilateral flank pain. She states this is episodic. Denies any currently. No radiation of the pain to the legs. No recent heavy lifting. Denies any numbness or weakness. Denies urinary frequency, dysuria or urgency. Past Medical History:  Diagnosis Date  . Diabetes mellitus without complication (HCC)   . High cholesterol     Patient Active Problem List   Diagnosis Date Noted  . Hypertension 06/02/2016  . Left hip pain 06/02/2016  . Heartburn 04/08/2016  . Mixed hyperlipidemia 11/14/2015  . Uncontrolled type 2 diabetes mellitus without complication, with long-term current use of insulin (HCC) 11/14/2015  . Acquired hypothyroidism 11/14/2015    Past Surgical History:  Procedure Laterality Date  . ECTOPIC PREGNANCY SURGERY    . TUBAL LIGATION      OB History    No data available       Home Medications    Prior to Admission medications   Medication Sig Start Date End Date Taking? Authorizing Provider  cetirizine (ZYRTEC) 10 MG tablet Take one tablet every morning as needed for itching. 06/19/16   John Molpus, MD  dicyclomine (BENTYL) 20 MG tablet Take 1 tablet (20 mg total) by mouth 2 (two) times daily as needed for spasms. 09/02/16   Loren Racer, MD  docusate sodium (COLACE) 100 MG capsule Take 1 capsule (100 mg total) by mouth every 12 (twelve) hours. 09/02/16   Loren Racer, MD    glipiZIDE (GLUCOTROL XL) 10 MG 24 hr tablet Take by mouth.    Historical Provider, MD  Insulin Aspart (NOVOLOG [place]) Inject into the skin.    Historical Provider, MD  insulin lispro protamine-lispro (HUMALOG 75/25 MIX) (75-25) 100 UNIT/ML SUSP injection Inject 50 Units into the skin 2 (two) times daily with a meal. 50 units AM, 55 units PM    Historical Provider, MD  metFORMIN (GLUCOPHAGE) 850 MG tablet Take by mouth.    Historical Provider, MD  metroNIDAZOLE (FLAGYL) 500 MG tablet Take 1 tablet (500 mg total) by mouth 2 (two) times daily. One po bid x 7 days 09/02/16   Loren Racer, MD  pravastatin (PRAVACHOL) 20 MG tablet Take by mouth.    Historical Provider, MD    Family History No family history on file.  Social History Social History  Substance Use Topics  . Smoking status: Former Games developer  . Smokeless tobacco: Never Used  . Alcohol use Yes     Comment: weekly     Allergies   Etodolac; Macrolides and ketolides; Meloxicam; and Oxycontin [oxycodone hcl]   Review of Systems Review of Systems  Constitutional: Negative for chills and fever.  Eyes: Negative for visual disturbance.  Respiratory: Negative for shortness of breath.   Cardiovascular: Negative for chest pain.  Gastrointestinal: Positive for abdominal distention and abdominal pain. Negative for constipation, diarrhea, nausea and vomiting.  Genitourinary: Positive for flank pain and vaginal discharge. Negative for dysuria, frequency, hematuria, pelvic  pain and vaginal bleeding.  Musculoskeletal: Positive for back pain and myalgias. Negative for arthralgias, neck pain and neck stiffness.  Neurological: Negative for dizziness, weakness, light-headedness, numbness and headaches.  All other systems reviewed and are negative.    Physical Exam Updated Vital Signs BP 136/88 (BP Location: Right Arm)   Pulse 75   Temp 98.1 F (36.7 C) (Oral)   Resp 18   Ht 5\' 4"  (1.626 m)   Wt 169 lb (76.7 kg)   LMP 08/20/2016   SpO2  100%   BMI 29.01 kg/m   Physical Exam  Constitutional: She is oriented to person, place, and time. She appears well-developed and well-nourished. No distress.  Patient is in no acute distress.  HENT:  Head: Normocephalic and atraumatic.  Mouth/Throat: Oropharynx is clear and moist.  Eyes: EOM are normal. Pupils are equal, round, and reactive to light.  Neck: Normal range of motion. Neck supple.  Cardiovascular: Normal rate and regular rhythm.  Exam reveals no gallop and no friction rub.   No murmur heard. Pulmonary/Chest: Effort normal and breath sounds normal. No respiratory distress. She has no wheezes. She has no rales. She exhibits no tenderness.  Abdominal: Soft. Bowel sounds are normal. She exhibits no distension and no mass. There is no tenderness. There is no rebound and no guarding. No hernia.  Genitourinary: Vaginal discharge found.  Genitourinary Comments: No cervical motion tenderness. Mild left adnexal tenderness to palpation. Clear vaginal discharge. Rectal exam with no gross blood. Rectal vault is empty. Questionable internal hemorrhoids present.  Musculoskeletal: Normal range of motion. She exhibits no edema or tenderness.  No midline thoracic or lumbar tenderness. No CVA tenderness. Patient does have some mild bilateral paraspinal lumbar tenderness to palpation. Negative straight leg raise. Distal pulses are 2+. No lower extremity swelling, asymmetry or tenderness.  Neurological: She is alert and oriented to person, place, and time.  5/5 motor in all extremities. Sensation is fully intact. Ambulating without difficulty.  Skin: Skin is warm and dry. Capillary refill takes less than 2 seconds. No rash noted. No erythema.  Psychiatric: She has a normal mood and affect. Her behavior is normal.  Nursing note and vitals reviewed.    ED Treatments / Results  Labs (all labs ordered are listed, but only abnormal results are displayed) Labs Reviewed  WET PREP, GENITAL -  Abnormal; Notable for the following:       Result Value   Clue Cells Wet Prep HPF POC PRESENT (*)    WBC, Wet Prep HPF POC MODERATE (*)    All other components within normal limits  URINALYSIS, ROUTINE W REFLEX MICROSCOPIC - Abnormal; Notable for the following:    Glucose, UA 100 (*)    All other components within normal limits  CBC WITH DIFFERENTIAL/PLATELET - Abnormal; Notable for the following:    RBC 3.76 (*)    Hemoglobin 11.5 (*)    HCT 34.4 (*)    All other components within normal limits  PREGNANCY, URINE  OCCULT BLOOD X 1 CARD TO LAB, STOOL  GC/CHLAMYDIA PROBE AMP ([place]) NOT AT [facility]    EKG  EKG Interpretation None       Radiology Dg Abdomen 1 View  Result Date: 09/02/2016 CLINICAL DATA:  Abdominal and flank pain for 2 months. EXAM: ABDOMEN - 1 VIEW COMPARISON:  CT scan 11/23/2015 FINDINGS: The lung bases are clear. The bowel gas pattern is unremarkable. The soft tissue shadows are maintained. No worrisome calcifications. Stable left pelvic calcification likely  due to a dermoid. IMPRESSION: No plain film findings for an acute abdominal process. Electronically Signed   By: Rudie Meyer M.D.   On: 09/02/2016 15:30   US Transvaginal Non-ob  Result Date: 09/02/2016 CLINICAL DATA:  Chronic left lower quadrant and pelvic pain, radiating to the lower back. Initial encounter. EXAM: TRANSABDOMINAL AND TRANSVAGINAL ULTRASOUND OF PELVIS TECHNIQUE: Both transabdominal and transvaginal ultrasound examinations of the pelvis were performed. Transabdominal technique was performed for global imaging of the pelvis including uterus, ovaries, adnexal regions, and pelvic cul-de-sac. It was necessary to proceed with endovaginal exam following the transabdominal exam to visualize the ovaries in greater detail. COMPARISON:  CT of the abdomen and pelvis performed 11/23/2015 FINDINGS: Uterus Measurements: 11.0 x 5.0 x 6.2 cm. No fibroids or other mass visualized. Endometrium Thickness: 1.4 cm.   No focal abnormality visualized. Right ovary Measurements: 4.3 x 3.9 x 2.5 cm. Normal appearance/no adnexal mass. Left ovary Measurements: 3.0 x 2.5 x 2.8 cm. Normal appearance/no adnexal mass. Other findings No abnormal free fluid. IMPRESSION: Unremarkable pelvic ultrasound.  No evidence for ovarian torsion. Electronically Signed   By: Roanna Raider M.D.   On: 09/02/2016 18:34   US Pelvis Complete  Result Date: 09/02/2016 CLINICAL DATA:  Chronic left lower quadrant and pelvic pain, radiating to the lower back. Initial encounter. EXAM: TRANSABDOMINAL AND TRANSVAGINAL ULTRASOUND OF PELVIS TECHNIQUE: Both transabdominal and transvaginal ultrasound examinations of the pelvis were performed. Transabdominal technique was performed for global imaging of the pelvis including uterus, ovaries, adnexal regions, and pelvic cul-de-sac. It was necessary to proceed with endovaginal exam following the transabdominal exam to visualize the ovaries in greater detail. COMPARISON:  CT of the abdomen and pelvis performed 11/23/2015 FINDINGS: Uterus Measurements: 11.0 x 5.0 x 6.2 cm. No fibroids or other mass visualized. Endometrium Thickness: 1.4 cm.  No focal abnormality visualized. Right ovary Measurements: 4.3 x 3.9 x 2.5 cm. Normal appearance/no adnexal mass. Left ovary Measurements: 3.0 x 2.5 x 2.8 cm. Normal appearance/no adnexal mass. Other findings No abnormal free fluid. IMPRESSION: Unremarkable pelvic ultrasound.  No evidence for ovarian torsion. Electronically Signed   By: Roanna Raider M.D.   On: 09/02/2016 18:34    Procedures Procedures (including critical care time)  Medications Ordered in ED Medications - No data to display   Initial Impression / Assessment and Plan / ED Course  I have reviewed the triage vital signs and the nursing notes.  Pertinent labs & imaging results that were available during my care of the patient were reviewed by me and considered in my medical decision making (see chart for  details).    Abdominal exam continues to be benign. Will treat for bacterial vaginosis. Also start on bowel regimen for questionable constipation. Return precautions given.   Final Clinical Impressions(s) / ED Diagnoses   Final diagnoses:  Bacterial vaginosis  Lower abdominal pain    New Prescriptions Discharge Medication List as of 09/02/2016  7:28 PM    START taking these medications   Details  dicyclomine (BENTYL) 20 MG tablet Take 1 tablet (20 mg total) by mouth 2 (two) times daily as needed for spasms., Starting Wed 09/02/2016, Print    docusate sodium (COLACE) 100 MG capsule Take 1 capsule (100 mg total) by mouth every 12 (twelve) hours., Starting Wed 09/02/2016, Print    metroNIDAZOLE (FLAGYL) 500 MG tablet Take 1 tablet (500 mg total) by mouth 2 (two) times daily. One po bid x 7 days, Starting Wed 09/02/2016, Print  Loren Racer, MD 09/03/16 [phone]

## 2016-09-02 NOTE — ED Triage Notes (Signed)
C/o abd pain, back pain x 2 mos-denies v/d-NAD-steady gait

## 2016-09-03 LAB — GC/CHLAMYDIA PROBE AMP (~~LOC~~) NOT AT ARMC
CHLAMYDIA, DNA PROBE: NEGATIVE
NEISSERIA GONORRHEA: NEGATIVE

## 2016-12-09 ENCOUNTER — Emergency Department (HOSPITAL_BASED_OUTPATIENT_CLINIC_OR_DEPARTMENT_OTHER)
Admission: EM | Admit: 2016-12-09 | Discharge: 2016-12-09 | Disposition: A | Discharge status: Home / Self Care | Payer: Medicaid Other | Attending: Emergency Medicine | Admitting: Emergency Medicine

## 2016-12-09 ENCOUNTER — Encounter (HOSPITAL_BASED_OUTPATIENT_CLINIC_OR_DEPARTMENT_OTHER): Payer: Self-pay | Admitting: Emergency Medicine

## 2016-12-09 DIAGNOSIS — Z794 Long term (current) use of insulin: Secondary | ICD-10-CM | POA: Insufficient documentation

## 2016-12-09 DIAGNOSIS — Z87891 Personal history of nicotine dependence: Secondary | ICD-10-CM | POA: Insufficient documentation

## 2016-12-09 DIAGNOSIS — R197 Diarrhea, unspecified: Secondary | ICD-10-CM | POA: Insufficient documentation

## 2016-12-09 DIAGNOSIS — R112 Nausea with vomiting, unspecified: Secondary | ICD-10-CM

## 2016-12-09 DIAGNOSIS — E119 Type 2 diabetes mellitus without complications: Secondary | ICD-10-CM | POA: Insufficient documentation

## 2016-12-09 DIAGNOSIS — M62838 Other muscle spasm: Secondary | ICD-10-CM | POA: Insufficient documentation

## 2016-12-09 DIAGNOSIS — I10 Essential (primary) hypertension: Secondary | ICD-10-CM | POA: Insufficient documentation

## 2016-12-09 DIAGNOSIS — R109 Unspecified abdominal pain: Secondary | ICD-10-CM | POA: Insufficient documentation

## 2016-12-09 LAB — PREGNANCY, URINE: Preg Test, Ur: NEGATIVE

## 2016-12-09 LAB — URINALYSIS, ROUTINE W REFLEX MICROSCOPIC
Bilirubin Urine: NEGATIVE
Glucose, UA: >=500 mg/dL — AB
Hgb urine dipstick: NEGATIVE
Ketones, ur: NEGATIVE mg/dL
LEUKOCYTES UA: NEGATIVE
Nitrite: NEGATIVE
PH: 5.5 (ref 5.0–8.0)
PROTEIN: NEGATIVE mg/dL
Specific Gravity, Urine: 1.008 (ref 1.005–1.030)

## 2016-12-09 LAB — URINALYSIS, MICROSCOPIC (REFLEX)

## 2016-12-09 LAB — I-STAT CG4 LACTIC ACID, ED: Lactic Acid, Venous: 2.1 mmol/L (ref 0.5–1.9)

## 2016-12-09 LAB — CBC WITH DIFFERENTIAL/PLATELET
BASOS PCT: 0 %
Basophils Absolute: 0 10*3/uL (ref 0.0–0.1)
Eosinophils Absolute: 0 10*3/uL (ref 0.0–0.7)
Eosinophils Relative: 1 %
HCT: 38 % (ref 36.0–46.0)
HEMOGLOBIN: 12.8 g/dL (ref 12.0–15.0)
LYMPHS ABS: 2.1 10*3/uL (ref 0.7–4.0)
Lymphocytes Relative: 36 %
MCH: 30.5 pg (ref 26.0–34.0)
MCHC: 33.7 g/dL (ref 30.0–36.0)
MCV: 90.5 fL (ref 78.0–100.0)
Monocytes Absolute: 0.3 10*3/uL (ref 0.1–1.0)
Monocytes Relative: 5 %
NEUTROS ABS: 3.5 10*3/uL (ref 1.7–7.7)
NEUTROS PCT: 58 %
Platelets: 352 10*3/uL (ref 150–400)
RBC: 4.2 MIL/uL (ref 3.87–5.11)
RDW: 14 % (ref 11.5–15.5)
WBC: 5.9 10*3/uL (ref 4.0–10.5)

## 2016-12-09 LAB — COMPREHENSIVE METABOLIC PANEL
ALBUMIN: 3.8 g/dL (ref 3.5–5.0)
ALK PHOS: 76 U/L (ref 38–126)
ALT: 11 U/L — AB (ref 14–54)
AST: 18 U/L (ref 15–41)
Anion gap: 9 (ref 5–15)
BUN: 8 mg/dL (ref 6–20)
CALCIUM: 9.6 mg/dL (ref 8.9–10.3)
CO2: 26 mmol/L (ref 22–32)
CREATININE: 0.62 mg/dL (ref 0.44–1.00)
Chloride: 97 mmol/L — ABNORMAL LOW (ref 101–111)
GFR calc Af Amer: >60 mL/min (ref >60)
GFR calc non Af Amer: >60 mL/min (ref >60)
GLUCOSE: 324 mg/dL — AB (ref 65–99)
Potassium: 3.9 mmol/L (ref 3.5–5.1)
Sodium: 132 mmol/L — ABNORMAL LOW (ref 135–145)
Total Bilirubin: 0.3 mg/dL (ref 0.3–1.2)
Total Protein: 8.2 g/dL — ABNORMAL HIGH (ref 6.5–8.1)

## 2016-12-09 LAB — LIPASE, BLOOD: Lipase: 34 U/L (ref 11–51)

## 2016-12-09 MED ORDER — CYCLOBENZAPRINE HCL 10 MG PO TABS: 10.0000 mg | ORAL_TABLET | Freq: Two times a day (BID) | ORAL | 0 refills | Status: DC | PRN

## 2016-12-09 MED ORDER — ONDANSETRON HCL 4 MG/2ML IJ SOLN
4.0000 mg | Freq: Once | INTRAMUSCULAR | Status: AC
  Administered 2016-12-09: 4 mg via INTRAVENOUS
  Filled 2016-12-09: qty 2

## 2016-12-09 MED ORDER — ONDANSETRON HCL 4 MG PO TABS: 4.0000 mg | ORAL_TABLET | Freq: Three times a day (TID) | ORAL | 0 refills | Status: DC | PRN

## 2016-12-09 MED ORDER — SODIUM CHLORIDE 0.9 % IV BOLUS (SEPSIS)
1000.0000 mL | Freq: Once | INTRAVENOUS | Status: AC
  Administered 2016-12-09: 1000 mL via INTRAVENOUS

## 2016-12-09 NOTE — ED Notes (Signed)
Cheese and saltine crackers and water per RN Keli given to Pt

## 2016-12-09 NOTE — Discharge Instructions (Signed)
Please take your nausea medicine to help stay hydrated. He is taking muscle relaxant to help with your muscle spasm pain on your neck. Please schedule a follow-up appointment with your PCP and your diabetes doctor. If any symptoms change or worsen, please return to the nearest emergency department.

## 2016-12-09 NOTE — ED Triage Notes (Signed)
Patient states that she was having mid periumbilcal pain yesterday  - the patient reports today that she just have a "sour" feeling to her stomach like she is going to throw up. Patient also reports that last night she started to feel some right sided neck pain and stiffness. Patient reports that she gets this feeling frequently with the start of her period

## 2016-12-09 NOTE — ED Provider Notes (Signed)
[facility]   CSN: [id number] Arrival date & time: 12/09/16  0915     History   Chief Complaint Chief Complaint  Patient presents with  . Nausea    HPI Shawna Coleman is a 41 y.o. female.  The history is provided by the patient and medical records.  Emesis   This is a new problem. The current episode started yesterday. The problem occurs 5 to 10 times per day. The problem has not changed since onset.The emesis has an appearance of stomach contents. There has been no fever. Associated symptoms include abdominal pain and diarrhea. Pertinent negatives include no arthralgias, no chills, no cough, no fever, no headaches, no sweats and no URI. Risk factors include ill contacts.    Past Medical History:  Diagnosis Date  . Diabetes mellitus without complication (HCC)   . High cholesterol     Patient Active Problem List   Diagnosis Date Noted  . Hypertension 06/02/2016  . Left hip pain 06/02/2016  . Heartburn 04/08/2016  . Mixed hyperlipidemia 11/14/2015  . Uncontrolled type 2 diabetes mellitus without complication, with long-term current use of insulin (HCC) 11/14/2015  . Acquired hypothyroidism 11/14/2015    Past Surgical History:  Procedure Laterality Date  . ECTOPIC PREGNANCY SURGERY    . TUBAL LIGATION      OB History    No data available       Home Medications    Prior to Admission medications   Medication Sig Start Date End Date Taking? Authorizing Provider  cetirizine (ZYRTEC) 10 MG tablet Take one tablet every morning as needed for itching. 06/19/16   John Molpus, MD  dicyclomine (BENTYL) 20 MG tablet Take 1 tablet (20 mg total) by mouth 2 (two) times daily as needed for spasms. 09/02/16   Loren Racer, MD  docusate sodium (COLACE) 100 MG capsule Take 1 capsule (100 mg total) by mouth every 12 (twelve) hours. 09/02/16   Loren Racer, MD  glipiZIDE (GLUCOTROL XL) 10 MG 24 hr tablet Take by mouth.    Historical Provider, MD    Insulin Aspart (NOVOLOG [place]) Inject into the skin.    Historical Provider, MD  insulin lispro protamine-lispro (HUMALOG 75/25 MIX) (75-25) 100 UNIT/ML SUSP injection Inject 50 Units into the skin 2 (two) times daily with a meal. 50 units AM, 55 units PM    Historical Provider, MD  metFORMIN (GLUCOPHAGE) 850 MG tablet Take by mouth.    Historical Provider, MD  metroNIDAZOLE (FLAGYL) 500 MG tablet Take 1 tablet (500 mg total) by mouth 2 (two) times daily. One po bid x 7 days 09/02/16   Loren Racer, MD  pravastatin (PRAVACHOL) 20 MG tablet Take by mouth.    Historical Provider, MD    Family History History reviewed. No pertinent family history.  Social History Social History  Substance Use Topics  . Smoking status: Former Games developer  . Smokeless tobacco: Never Used  . Alcohol use Yes     Comment: weekly     Allergies   Etodolac; Macrolides and ketolides; Meloxicam; and Oxycontin [oxycodone hcl]   Review of Systems Review of Systems  Constitutional: Negative for appetite change, chills, diaphoresis, fatigue and fever.  HENT: Negative for congestion.   Respiratory: Negative for cough, chest tightness, shortness of breath, wheezing and stridor.   Cardiovascular: Negative for chest pain and palpitations.  Gastrointestinal: Positive for abdominal pain, diarrhea, nausea and vomiting. Negative for constipation.  Genitourinary: Negative for dysuria.  Musculoskeletal: Negative for arthralgias, back pain,  neck pain and neck stiffness.  Skin: Negative for rash and wound.  Neurological: Negative for syncope, light-headedness and headaches.  Psychiatric/Behavioral: Negative for agitation.  All other systems reviewed and are negative.    Physical Exam Updated Vital Signs BP (!) 148/84 (BP Location: Right Arm)   Pulse 80   Temp 98.7 F (37.1 C) (Oral)   Resp 18   Ht 5' 4.5" (1.638 m)   Wt 160 lb (72.6 kg)   LMP 11/14/2016   SpO2 100%   BMI 27.04 kg/m   Physical Exam   Constitutional: She is oriented to person, place, and time. She appears well-developed and well-nourished. No distress.  HENT:  Head: Normocephalic and atraumatic.  Mouth/Throat: Oropharynx is clear and moist.  Eyes: Conjunctivae and EOM are normal. Pupils are equal, round, and reactive to light.  Neck: Normal range of motion. Neck supple.  Cardiovascular: Normal rate, regular rhythm and intact distal pulses.   No murmur heard. Pulmonary/Chest: Effort normal and breath sounds normal. No respiratory distress.    Abdominal: Soft. There is no tenderness. There is no rebound and no guarding.  Musculoskeletal: She exhibits no edema.  Neurological: She is alert and oriented to person, place, and time. No cranial nerve deficit or sensory deficit. She exhibits normal muscle tone.  Skin: Skin is warm and dry. Capillary refill takes less than 2 seconds. No rash noted. No erythema.  Psychiatric: She has a normal mood and affect.  Nursing note and vitals reviewed.    ED Treatments / Results  Labs (all labs ordered are listed, but only abnormal results are displayed) Labs Reviewed  COMPREHENSIVE METABOLIC PANEL - Abnormal; Notable for the following:       Result Value   Sodium 132 (*)    Chloride 97 (*)    Glucose, Bld 324 (*)    Total Protein 8.2 (*)    ALT 11 (*)    All other components within normal limits  URINALYSIS, ROUTINE W REFLEX MICROSCOPIC - Abnormal; Notable for the following:    Glucose, UA >=500 (*)    All other components within normal limits  URINALYSIS, MICROSCOPIC (REFLEX) - Abnormal; Notable for the following:    Bacteria, UA FEW (*)    Squamous Epithelial / LPF 0-5 (*)    All other components within normal limits  I-STAT CG4 LACTIC ACID, ED - Abnormal; Notable for the following:    Lactic Acid, Venous 2.10 (*)    All other components within normal limits  CBC WITH DIFFERENTIAL/PLATELET  LIPASE, BLOOD  PREGNANCY, URINE    EKG  EKG Interpretation None        Radiology No results found.  Procedures Procedures (including critical care time)  Medications Ordered in ED Medications  ondansetron (ZOFRAN) injection 4 mg (4 mg Intravenous Given 12/09/16 1147)  sodium chloride 0.9 % bolus 1,000 mL (0 mLs Intravenous Stopped 12/09/16 1346)     Initial Impression / Assessment and Plan / ED Course  I have reviewed the triage vital signs and the nursing notes.  Pertinent labs & imaging results that were available during my care of the patient were reviewed by me and considered in my medical decision making (see chart for details).     Lithzy Bernard is a 41 y.o. female With a past medical history significant for diabetes, hypercholesterolemia, and muscle spasms who presents with nausea, vomiting, diarrhea, abdominal cramping, and muscle spasms. Patient reports that for the last several days the worsening yesterday, she has had a "sour  feeling" in her stomach. She says that she interacts with lots of people at her work who have been sick. She reports nausea and vomiting as well as loose stools. She reports mild to moderate abdominal cramping in epigastric area. She denies any blood in her vomit or her stool. She denies any urinary changes. She denies any rhinorrhea, cough, or congestion. She does report her right lateral neck has had muscle cramping pain and tenderness. She denies any traumas. She denies any other complaints including a fevers or chills.  On exam, patient has no abdominal tenderness for me. She has no CVA tenderness. Her lungs are clear. Chest is nontender. Palpable muscle spasms are felt on the lateral right neck.  Patient given nausea medicine and fluids given her likely dehydration with her symptoms.  Patient had screening laboratory testing performed to look for significant electrolyte abnormalities. Diagnostic testing showed slightly a little lactic acid 2.1. Patient was given fluids. Patient not want to wait for a second lactic acid.  Lipase nonelevated, CMP revealed elevated glucose which patient is aware of. No evidence of DKA. Urinalysis did not show infection given lack of urinary symptoms. No leukocytosis or anemia present.  Patient felt much better after fluids and nausea medication. Suspect a viral gastroenteritis. Patient will be a prescription for nausea medication as well as Flexeril which she reports of the one muscle relaxant to help her in the past for spasms.  Patient given instructions to follow up with her PCP as well as return precautions for any worsening symptoms. Patient voiced understanding of the plan of care and was discharged in good condition.   Final Clinical Impressions(s) / ED Diagnoses   Final diagnoses:  Non-intractable vomiting with nausea, unspecified vomiting type  Muscle spasm    New Prescriptions Discharge Medication List as of 12/09/2016  2:22 PM    START taking these medications   Details  cyclobenzaprine (FLEXERIL) 10 MG tablet Take 1 tablet (10 mg total) by mouth 2 (two) times daily as needed for muscle spasms., Starting Wed 12/09/2016, Print    ondansetron (ZOFRAN) 4 MG tablet Take 1 tablet (4 mg total) by mouth every 8 (eight) hours as needed for nausea or vomiting., Starting Wed 12/09/2016, Print       Clinical Impression: 1. Non-intractable vomiting with nausea, unspecified vomiting type   2. Muscle spasm     Disposition: Discharge  Condition: Good  I have discussed the results, Dx and Tx plan with the pt(& family if present). He/she/they expressed understanding and agree(s) with the plan. Discharge instructions discussed at great length. Strict return precautions discussed and pt &/or family have verbalized understanding of the instructions. No further questions at time of discharge.    Discharge Medication List as of 12/09/2016  2:22 PM    START taking these medications   Details  cyclobenzaprine (FLEXERIL) 10 MG tablet Take 1 tablet (10 mg total) by mouth 2 (two) times  daily as needed for muscle spasms., Starting Wed 12/09/2016, Print    ondansetron (ZOFRAN) 4 MG tablet Take 1 tablet (4 mg total) by mouth every 8 (eight) hours as needed for nausea or vomiting., Starting Wed 12/09/2016, Print        Follow Up: [facility] [address] [place] [address] [phone] Schedule an appointment as soon as possible for a visit    [facility] [place] [facility] [address] [id number] [address]c [place] [address] [phone]  If symptoms worsen  Izell , MD [address] [place] [address] [phone]        Heide Scales, MD 12/10/16 [phone]

## 2017-01-09 ENCOUNTER — Emergency Department (HOSPITAL_BASED_OUTPATIENT_CLINIC_OR_DEPARTMENT_OTHER)
Admission: EM | Admit: 2017-01-09 | Discharge: 2017-01-09 | Disposition: A | Discharge status: Home / Self Care | Payer: BLUE CROSS/BLUE SHIELD | Attending: Emergency Medicine | Admitting: Emergency Medicine

## 2017-01-09 ENCOUNTER — Encounter (HOSPITAL_BASED_OUTPATIENT_CLINIC_OR_DEPARTMENT_OTHER): Payer: Self-pay | Admitting: *Deleted

## 2017-01-09 ENCOUNTER — Emergency Department (HOSPITAL_BASED_OUTPATIENT_CLINIC_OR_DEPARTMENT_OTHER): Payer: BLUE CROSS/BLUE SHIELD

## 2017-01-09 DIAGNOSIS — Z794 Long term (current) use of insulin: Secondary | ICD-10-CM | POA: Diagnosis not present

## 2017-01-09 DIAGNOSIS — R05 Cough: Secondary | ICD-10-CM | POA: Insufficient documentation

## 2017-01-09 DIAGNOSIS — Z87891 Personal history of nicotine dependence: Secondary | ICD-10-CM | POA: Diagnosis not present

## 2017-01-09 DIAGNOSIS — E119 Type 2 diabetes mellitus without complications: Secondary | ICD-10-CM | POA: Diagnosis not present

## 2017-01-09 DIAGNOSIS — Z79899 Other long term (current) drug therapy: Secondary | ICD-10-CM | POA: Insufficient documentation

## 2017-01-09 DIAGNOSIS — R059 Cough, unspecified: Secondary | ICD-10-CM

## 2017-01-09 LAB — RAPID STREP SCREEN (MED CTR MEBANE ONLY): STREPTOCOCCUS, GROUP A SCREEN (DIRECT): NEGATIVE

## 2017-01-09 MED ORDER — BENZONATATE 100 MG PO CAPS: 100.0000 mg | ORAL_CAPSULE | Freq: Three times a day (TID) | ORAL | 0 refills | Status: DC | PRN

## 2017-01-09 MED ORDER — BENZONATATE 100 MG PO CAPS
100.0000 mg | ORAL_CAPSULE | Freq: Once | ORAL | Status: AC
  Administered 2017-01-09: 100 mg via ORAL
  Filled 2017-01-09: qty 1

## 2017-01-09 MED ORDER — HYDROCOD POLST-CPM POLST ER 10-8 MG/5ML PO SUER: 5.0000 mL | Freq: Two times a day (BID) | ORAL | 0 refills | Status: DC | PRN

## 2017-01-09 NOTE — ED Provider Notes (Signed)
[facility] Provider Note: Shawna DellJ. Lane Billye Pickerel, MD, FACEP  CSN: [id number] MRN: [id number] ARRIVAL: 01/09/17 at 0315 ROOM: MH05/[facility]05   CHIEF COMPLAINT  Cough   HISTORY OF PRESENT ILLNESS  Shawna Coleman is a 41 y.o. female with a one-week history of cough. The cough worsened 2 days ago and it is keeping her awake at night. She has had associated sneezing, nasal congestion, sore throat, and chest wall and abdominal wall pain from coughing. Her cough has been largely nonproductive. She has not had a fever. She denies nausea, vomiting or diarrhea. She has taken Zyrtec, Tylenol and Alka-Seltzer at various times without relief of her cough. She is able to tolerate her other symptoms but not the cough. She used her son's albuterol inhaler without relief plus she did not like the side effects (tremor, tachycardia). She rates her overall pain as an 8 out of 10.   Past Medical History:  Diagnosis Date  . Diabetes mellitus without complication (HCC)   . High cholesterol     Past Surgical History:  Procedure Laterality Date  . ECTOPIC PREGNANCY SURGERY    . TUBAL LIGATION      No family history on file.  Social History  Substance Use Topics  . Smoking status: Former Games developermoker  . Smokeless tobacco: Never Used  . Alcohol use Yes     Comment: weekly    Prior to Admission medications   Medication Sig Start Date End Date Taking? Authorizing Provider  cetirizine (ZYRTEC) 10 MG tablet Take one tablet every morning as needed for itching. 06/19/16   Kelda Azad, MD  cyclobenzaprine (FLEXERIL) 10 MG tablet Take 1 tablet (10 mg total) by mouth 2 (two) times daily as needed for muscle spasms. 12/09/16   Tegeler, Canary Brimhristopher J, MD  dicyclomine (BENTYL) 20 MG tablet Take 1 tablet (20 mg total) by mouth 2 (two) times daily as needed for spasms. 09/02/16   Loren RacerYelverton, David, MD  docusate sodium (COLACE) 100 MG capsule Take 1 capsule (100 mg total) by mouth every 12 (twelve) hours. 09/02/16    Loren RacerYelverton, David, MD  glipiZIDE (GLUCOTROL XL) 10 MG 24 hr tablet Take by mouth.    [provider]  Insulin Aspart (NOVOLOG [place]) Inject into the skin.    [provider]  insulin lispro protamine-lispro (HUMALOG 75/25 MIX) (75-25) 100 UNIT/ML SUSP injection Inject 50 Units into the skin 2 (two) times daily with a meal. 50 units AM, 55 units PM    [provider]  metFORMIN (GLUCOPHAGE) 850 MG tablet Take by mouth.    [provider]  metroNIDAZOLE (FLAGYL) 500 MG tablet Take 1 tablet (500 mg total) by mouth 2 (two) times daily. One po bid x 7 days 09/02/16   Loren RacerYelverton, David, MD  ondansetron (ZOFRAN) 4 MG tablet Take 1 tablet (4 mg total) by mouth every 8 (eight) hours as needed for nausea or vomiting. 12/09/16   Tegeler, Canary Brimhristopher J, MD  pravastatin (PRAVACHOL) 20 MG tablet Take by mouth.    [provider]    Allergies Etodolac; Macrolides and ketolides; Meloxicam; and Oxycontin [oxycodone hcl]   REVIEW OF SYSTEMS  Negative except as noted here or in the History of Present Illness.   PHYSICAL EXAMINATION  Initial Vital Signs Blood pressure (!) 146/87, pulse 93, temperature 98.4 F (36.9 C), temperature source Oral, resp. rate 18, height 5\' 4"  (1.626 m), weight 73.9 kg (163 lb), last menstrual period 12/14/2016, SpO2 99 %.  Examination General: Well-developed, well-nourished female in  no acute distress; appearance consistent with age of record HENT: normocephalic; atraumatic; no pharyngeal erythema or exudate; TMs normal Eyes: pupils equal, round and reactive to light; extraocular muscles intact Neck: supple Heart: regular rate and rhythm Lungs: clear to auscultation bilaterally; frequent dry cough Abdomen: soft; nondistended; nontender; bowel sounds present Extremities: No deformity; full range of motion; pulses normal Neurologic: Awake, alert and oriented; motor function intact in all extremities and symmetric; no facial droop Skin: Warm  and dry Psychiatric: Normal mood and affect   RESULTS  Summary of this visit's results, reviewed by myself:   EKG Interpretation  Date/Time:    Ventricular Rate:    PR Interval:    QRS Duration:   QT Interval:    QTC Calculation:   R Axis:     Text Interpretation:        Laboratory Studies: Results for orders placed or performed during the hospital encounter of 01/09/17 (from the past 24 hour(s))  Rapid strep screen     Status: None   Collection Time: 01/09/17  3:25 AM  Result Value Ref Range   Streptococcus, Group A Screen (Direct) NEGATIVE NEGATIVE   Imaging Studies: Dg Chest 2 View  Result Date: 01/09/2017 CLINICAL DATA:  Acute onset of cough.  Initial encounter. EXAM: CHEST  2 VIEW COMPARISON:  Chest radiograph performed 12/06/2014 FINDINGS: The lungs are well-aerated and clear. There is no evidence of focal opacification, pleural effusion or pneumothorax. The heart is normal in size; the mediastinal contour is within normal limits. No acute osseous abnormalities are seen. IMPRESSION: No acute cardiopulmonary process seen. Electronically Signed   By: Roanna Raider M.D.   On: 01/09/2017 03:58    ED COURSE  Nursing notes and initial vitals signs, including pulse oximetry, reviewed.  Vitals:   01/09/17 0321 01/09/17 0325  BP:  (!) 146/87  Pulse:  93  Resp:  18  Temp:  98.4 F (36.9 C)  TempSrc:  Oral  SpO2:  99%  Weight: 73.9 kg (163 lb)   Height: 5\' 4"  (1.626 m)     PROCEDURES    ED DIAGNOSES     ICD-9-CM ICD-10-CM   1. Cough 786.2 R05        Rosabel Sermeno, Jonny Ruiz, MD 01/09/17 [phone]

## 2017-01-09 NOTE — ED Triage Notes (Signed)
C/o cough, ear and jaw pain , sorethroat, congestion and sneezing x 1 week

## 2017-01-10 LAB — CULTURE, GROUP A STREP (THRC)

## 2017-01-16 ENCOUNTER — Encounter (HOSPITAL_BASED_OUTPATIENT_CLINIC_OR_DEPARTMENT_OTHER): Payer: Self-pay | Admitting: Emergency Medicine

## 2017-01-16 ENCOUNTER — Emergency Department (HOSPITAL_BASED_OUTPATIENT_CLINIC_OR_DEPARTMENT_OTHER)
Admission: EM | Admit: 2017-01-16 | Discharge: 2017-01-16 | Disposition: A | Discharge status: Home / Self Care | Payer: BLUE CROSS/BLUE SHIELD | Attending: Emergency Medicine | Admitting: Emergency Medicine

## 2017-01-16 ENCOUNTER — Emergency Department (HOSPITAL_BASED_OUTPATIENT_CLINIC_OR_DEPARTMENT_OTHER): Payer: BLUE CROSS/BLUE SHIELD

## 2017-01-16 DIAGNOSIS — Z87891 Personal history of nicotine dependence: Secondary | ICD-10-CM | POA: Insufficient documentation

## 2017-01-16 DIAGNOSIS — J019 Acute sinusitis, unspecified: Secondary | ICD-10-CM | POA: Insufficient documentation

## 2017-01-16 DIAGNOSIS — J329 Chronic sinusitis, unspecified: Secondary | ICD-10-CM

## 2017-01-16 DIAGNOSIS — Z794 Long term (current) use of insulin: Secondary | ICD-10-CM | POA: Diagnosis not present

## 2017-01-16 DIAGNOSIS — Z79899 Other long term (current) drug therapy: Secondary | ICD-10-CM | POA: Insufficient documentation

## 2017-01-16 DIAGNOSIS — J40 Bronchitis, not specified as acute or chronic: Secondary | ICD-10-CM | POA: Insufficient documentation

## 2017-01-16 DIAGNOSIS — R05 Cough: Secondary | ICD-10-CM | POA: Diagnosis present

## 2017-01-16 DIAGNOSIS — E119 Type 2 diabetes mellitus without complications: Secondary | ICD-10-CM | POA: Insufficient documentation

## 2017-01-16 LAB — RAPID STREP SCREEN (MED CTR MEBANE ONLY): STREPTOCOCCUS, GROUP A SCREEN (DIRECT): NEGATIVE

## 2017-01-16 MED ORDER — PREDNISONE 20 MG PO TABS: 40.0000 mg | ORAL_TABLET | Freq: Every day | ORAL | 0 refills | Status: DC

## 2017-01-16 MED ORDER — AMOXICILLIN-POT CLAVULANATE 875-125 MG PO TABS
1.0000 | ORAL_TABLET | Freq: Once | ORAL | Status: AC
  Administered 2017-01-16: 1 via ORAL
  Filled 2017-01-16: qty 1

## 2017-01-16 MED ORDER — PREDNISONE 50 MG PO TABS
60.0000 mg | ORAL_TABLET | Freq: Once | ORAL | Status: AC
  Administered 2017-01-16: 60 mg via ORAL
  Filled 2017-01-16: qty 1

## 2017-01-16 MED ORDER — ACETAMINOPHEN 325 MG PO TABS
650.0000 mg | ORAL_TABLET | Freq: Once | ORAL | Status: AC
  Administered 2017-01-16: 650 mg via ORAL
  Filled 2017-01-16: qty 2

## 2017-01-16 MED ORDER — AMOXICILLIN-POT CLAVULANATE 875-125 MG PO TABS: 1.0000 | ORAL_TABLET | Freq: Two times a day (BID) | ORAL | 0 refills | Status: DC

## 2017-01-16 NOTE — Discharge Instructions (Signed)
Take Augmentin as prescribed until all gone. Take prednisone as prescribed until all gone. Continue Tylenol and Motrin for fever. Use saline intranasal spray for congestion. Chloraseptic throat spray for sore throat. Make sure to drink plenty of fluids. Follow up with your family doctor in 2 days for recheck. Return if worsening.

## 2017-01-16 NOTE — ED Provider Notes (Signed)
[facility] Provider Note   CSN: [id number] Arrival date & time: 01/16/17  2058   By signing my name below, I, Soijett Blue, attest that this documentation has been prepared under the direction and in the presence of Jaynie Crumble, [employer] Electronically Signed: Soijett Blue, ED Scribe. 01/16/17. 10:17 PM.  History   Chief Complaint Chief Complaint  Patient presents with  . Cough  . Knee Pain    HPI Shawna Coleman is a 41 y.o. female with a PMHx of DM, who presents to the Emergency Department complaining of productive cough x green sputum onset 3 weeks ago. Pt reports associated fever x today, fatigue, decreased PO intake, HA, sore throat, sinus pressure, sinus pain, nasal congestion, and CP due to cough. Pt has tried Rx tussionex,  and tessalon perles with no relief of her symptoms. She notes that she was seen in the ED last week with similar symptoms and prescribed tussionex and tessalon perles. 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 She denies SOB, nausea, vomiting, diarrhea, dysuria, and any other symptoms. Denies smoking cigarettes, hx of asthma, pregnancy, sick contacts, or recent travel.    The history is provided by the patient. No language interpreter was used.    Past Medical History:  Diagnosis Date  . Diabetes mellitus without complication (HCC)   . High cholesterol     Patient Active Problem List   Diagnosis Date Noted  . Hypertension 06/02/2016  . Left hip pain 06/02/2016  . Heartburn 04/08/2016  . Mixed hyperlipidemia 11/14/2015  . Uncontrolled type 2 diabetes mellitus without complication, with long-term current use of insulin (HCC) 11/14/2015  . Acquired hypothyroidism 11/14/2015    Past Surgical History:  Procedure Laterality Date  . ECTOPIC PREGNANCY SURGERY    . TUBAL LIGATION      OB History    No data available       Home Medications    Prior to Admission medications   Medication Sig Start Date End  Date Taking? Authorizing Provider  benzonatate (TESSALON) 100 MG capsule Take 1 capsule (100 mg total) by mouth 3 (three) times daily as needed for cough. 01/09/17   Molpus, John, MD  chlorpheniramine-HYDROcodone (TUSSIONEX PENNKINETIC ER) 10-8 MG/5ML SUER Take 5 mLs by mouth every 12 (twelve) hours as needed for cough. 01/09/17   Molpus, John, MD  cyclobenzaprine (FLEXERIL) 10 MG tablet Take 1 tablet (10 mg total) by mouth 2 (two) times daily as needed for muscle spasms. 12/09/16   Tegeler, Canary Brim, MD  docusate sodium (COLACE) 100 MG capsule Take 1 capsule (100 mg total) by mouth every 12 (twelve) hours. 09/02/16   Loren Racer, MD  glipiZIDE (GLUCOTROL XL) 10 MG 24 hr tablet Take by mouth.    [provider]  Insulin Aspart (NOVOLOG [place]) Inject into the skin.    [provider]  insulin lispro protamine-lispro (HUMALOG 75/25 MIX) (75-25) 100 UNIT/ML SUSP injection Inject 50 Units into the skin 2 (two) times daily with a meal. 50 units AM, 55 units PM    [provider]  metFORMIN (GLUCOPHAGE) 850 MG tablet Take by mouth.    [provider]  metroNIDAZOLE (FLAGYL) 500 MG tablet Take 1 tablet (500 mg total) by mouth 2 (two) times daily. One po bid x 7 days 09/02/16   Loren Racer, MD  ondansetron (ZOFRAN) 4 MG tablet Take 1 tablet (4 mg total) by mouth every 8 (eight) hours as needed for nausea or vomiting. 12/09/16  Tegeler, Canary Brimhristopher J, MD  pravastatin (PRAVACHOL) 20 MG tablet Take by mouth.    [provider]    Family History No family history on file.  Social History Social History  Substance Use Topics  . Smoking status: Former Games developermoker  . Smokeless tobacco: Never Used  . Alcohol use Yes     Comment: weekly     Allergies   Etodolac; Macrolides and ketolides; Meloxicam; and Oxycontin [oxycodone hcl]   Review of Systems Review of Systems  Constitutional: Positive for appetite change, fatigue and fever.  HENT: Positive for  congestion, sinus pain, sinus pressure and sore throat.   Respiratory: Positive for cough. Negative for shortness of breath.   Cardiovascular: Positive for chest pain (due to cough).  Gastrointestinal: Negative for diarrhea, nausea and vomiting.  Neurological: Positive for headaches.  All other systems reviewed and are negative.    Physical Exam Updated Vital Signs BP (!) 161/92 (BP Location: Left Arm)   Pulse (!) 123   Temp (!) 101.9 F (38.8 C) (Oral)   Resp 20   LMP 01/09/2017   SpO2 97%   Physical Exam  Constitutional: She is oriented to person, place, and time. She appears well-developed and well-nourished. No distress.  HENT:  Head: Normocephalic and atraumatic.  Right Ear: Tympanic membrane, external ear and ear canal normal.  Left Ear: Tympanic membrane, external ear and ear canal normal.  Nose: Mucosal edema and rhinorrhea present.  Mouth/Throat: Uvula is midline and mucous membranes are normal. Posterior oropharyngeal erythema present. No oropharyngeal exudate, posterior oropharyngeal edema or tonsillar abscesses.  Eyes: Conjunctivae are normal.  Neck: Neck supple.  No meningismus  Cardiovascular: Normal rate, regular rhythm, normal heart sounds and intact distal pulses.   Pulmonary/Chest: Effort normal and breath sounds normal. No respiratory distress. She has no wheezes. She has no rales.  Abdominal: Soft. Bowel sounds are normal. She exhibits no distension. There is no tenderness. There is no rebound.  Musculoskeletal: Normal range of motion. She exhibits no edema.  Neurological: She is alert and oriented to person, place, and time.  Skin: Skin is warm and dry.  Psychiatric: She has a normal mood and affect. Her behavior is normal.  Nursing note and vitals reviewed.    ED Treatments / Results  DIAGNOSTIC STUDIES: Oxygen Saturation is 97% on RA, nl by my interpretation.    COORDINATION OF CARE: 10:16 PM Discussed treatment plan with pt at bedside which  includes rapid strep screen and culture and pt agreed to plan.   Labs (all labs ordered are listed, but only abnormal results are displayed) Labs Reviewed  RAPID STREP SCREEN (NOT AT [facility])  CULTURE, GROUP A STREP [facility])    EKG  EKG Interpretation None       Radiology Dg Chest 2 View  Result Date: 01/16/2017 CLINICAL DATA:  Cough and congestion for 1 week.  Fever. EXAM: CHEST  2 VIEW COMPARISON:  Chest radiographs 01/09/2017 FINDINGS: The cardiomediastinal contours are normal. The lungs are clear. Pulmonary vasculature is normal. No consolidation, pleural effusion, or pneumothorax. No acute osseous abnormalities are seen. IMPRESSION: No acute pulmonary process. Electronically Signed   By: Rubye OaksMelanie  Ehinger M.D.   On: 01/16/2017 22:52    Procedures Procedures (including critical care time)  Medications Ordered in ED Medications  acetaminophen (TYLENOL) tablet 650 mg (650 mg Oral Given 01/16/17 2116)     Initial Impression / Assessment and Plan / ED Course  I have reviewed the triage vital signs and the nursing notes.  Pertinent labs & imaging results that were available during my care of the patient were reviewed by me and considered in my medical decision making (see chart for details).     Patient in emergency department with continued URI symptoms including congestion, sore throat, cough. She does have a fever today of 101.9. She is coughing, has slightly hoarse voice, sinus congestion. Chest x-ray repeated today and is negative. Rapid strep is negative. No meningismus on her exam. She is in no respiratory distress, oxygen saturations 99% on room air. She is slightly tachycardic however blood pressure is elevated. She is nontoxic appearing. I will start her on augmentin for possible sinusitis. Continue decongestants, cough medications, prednisone will be given. Follow-up with family doctor in 2 days.  Vitals:   01/16/17 2106 01/16/17 2228  BP: (!) 161/92 (!) 153/81  Pulse:  (!) 123 (!) 108  Resp: 20 20  Temp: (!) 101.9 F (38.8 C) 100 F (37.8 C)  TempSrc: Oral Oral  SpO2: 97% 99%     Final Clinical Impressions(s) / ED Diagnoses   Final diagnoses:  Sinusitis, unspecified chronicity, unspecified location  Bronchitis    New Prescriptions New Prescriptions   AMOXICILLIN-CLAVULANATE (AUGMENTIN) 875-125 MG TABLET    Take 1 tablet by mouth every 12 (twelve) hours.   PREDNISONE (DELTASONE) 20 MG TABLET    Take 2 tablets (40 mg total) by mouth daily.   I personally performed the services described in this documentation, which was scribed in my presence. The recorded information has been reviewed and is accurate.     Jaynie Crumble, PA-C 01/16/17 2332    Alvira Monday, MD 01/18/17 1549

## 2017-01-16 NOTE — ED Notes (Signed)
Pt states she was seen here last week for same. Has tried OTC meds and hydrocodone which was prescribed, but it only helped her sleep. Also c/o sinus pressure and H/A x 2 weeks. Right knee pain onset this a.m.

## 2017-01-16 NOTE — ED Notes (Signed)
Pt given d/c instructions as per chart. Rx x 2. Verbalizes understanding. No questions. 

## 2017-01-16 NOTE — ED Triage Notes (Signed)
PT presents with c/o cough, congestion for over a week and rt knee pain that started this am.

## 2017-01-19 LAB — CULTURE, GROUP A STREP (THRC)

## 2017-02-15 ENCOUNTER — Encounter (HOSPITAL_BASED_OUTPATIENT_CLINIC_OR_DEPARTMENT_OTHER): Payer: Self-pay | Admitting: *Deleted

## 2017-02-15 ENCOUNTER — Emergency Department (HOSPITAL_BASED_OUTPATIENT_CLINIC_OR_DEPARTMENT_OTHER)
Admission: EM | Admit: 2017-02-15 | Discharge: 2017-02-15 | Disposition: A | Discharge status: Home / Self Care | Payer: BLUE CROSS/BLUE SHIELD | Attending: Emergency Medicine | Admitting: Emergency Medicine

## 2017-02-15 DIAGNOSIS — E119 Type 2 diabetes mellitus without complications: Secondary | ICD-10-CM | POA: Diagnosis not present

## 2017-02-15 DIAGNOSIS — Z79899 Other long term (current) drug therapy: Secondary | ICD-10-CM | POA: Insufficient documentation

## 2017-02-15 DIAGNOSIS — Z794 Long term (current) use of insulin: Secondary | ICD-10-CM | POA: Insufficient documentation

## 2017-02-15 DIAGNOSIS — R0981 Nasal congestion: Secondary | ICD-10-CM | POA: Diagnosis not present

## 2017-02-15 DIAGNOSIS — Z87891 Personal history of nicotine dependence: Secondary | ICD-10-CM | POA: Insufficient documentation

## 2017-02-15 MED ORDER — OXYMETAZOLINE HCL 0.05 % NA SOLN
2.0000 | Freq: Two times a day (BID) | NASAL | Status: DC | PRN
  Administered 2017-02-15: 2 via NASAL
  Filled 2017-02-15: qty 15

## 2017-02-15 MED ORDER — ACETAMINOPHEN 325 MG PO TABS
650.0000 mg | ORAL_TABLET | Freq: Once | ORAL | Status: AC
  Administered 2017-02-15: 650 mg via ORAL
  Filled 2017-02-15: qty 2

## 2017-02-15 NOTE — ED Provider Notes (Signed)
[facility] Provider Note: Lowella Dell, MD, FACEP  CSN: [id number] MRN: [id number] ARRIVAL: 02/15/17 at 0130 ROOM: MH10/MH10   CHIEF COMPLAINT  Nasal Congestion   HISTORY OF PRESENT ILLNESS  Shawna Coleman is a 41 y.o. female who complains of nasal congestion for the past 3 days. She has been using fluticasone nasal spray as well as Mucinex without adequate relief. Her symptoms are severe enough to keep her from sleeping. She is also having headache and sinus and ear pressure. She has not had a fever. She has had a cough and anterior cervical lymphadenopathy. She was treated for sinusitis on June 9 and placed on Augmentin. She did not get relief and requested an alternative antibiotic from her primary care physician on June 15.   Past Medical History:  Diagnosis Date  . Diabetes mellitus without complication (HCC)   . High cholesterol     Past Surgical History:  Procedure Laterality Date  . ECTOPIC PREGNANCY SURGERY    . TUBAL LIGATION      No family history on file.  Social History  Substance Use Topics  . Smoking status: Former Games developer  . Smokeless tobacco: Never Used  . Alcohol use Yes     Comment: weekly    Prior to Admission medications   Medication Sig Start Date End Date Taking? Authorizing Provider  benzonatate (TESSALON) 100 MG capsule Take 1 capsule (100 mg total) by mouth 3 (three) times daily as needed for cough. 01/09/17   Perrie Ragin, MD  chlorpheniramine-HYDROcodone (TUSSIONEX PENNKINETIC ER) 10-8 MG/5ML SUER Take 5 mLs by mouth every 12 (twelve) hours as needed for cough. 01/09/17   Leor Whyte, MD  cyclobenzaprine (FLEXERIL) 10 MG tablet Take 1 tablet (10 mg total) by mouth 2 (two) times daily as needed for muscle spasms. 12/09/16   Tegeler, Canary Brim, MD  docusate sodium (COLACE) 100 MG capsule Take 1 capsule (100 mg total) by mouth every 12 (twelve) hours. 09/02/16   Loren Racer, MD  glipiZIDE (GLUCOTROL XL) 10 MG 24 hr tablet Take by  mouth.    [provider]  Insulin Aspart (NOVOLOG [place]) Inject into the skin.    [provider]  insulin lispro protamine-lispro (HUMALOG 75/25 MIX) (75-25) 100 UNIT/ML SUSP injection Inject 50 Units into the skin 2 (two) times daily with a meal. 50 units AM, 55 units PM    [provider]  metFORMIN (GLUCOPHAGE) 850 MG tablet Take by mouth.    [provider]  ondansetron (ZOFRAN) 4 MG tablet Take 1 tablet (4 mg total) by mouth every 8 (eight) hours as needed for nausea or vomiting. 12/09/16   Tegeler, Canary Brim, MD  pravastatin (PRAVACHOL) 20 MG tablet Take by mouth.    [provider]    Allergies Etodolac; Macrolides and ketolides; Meloxicam; and Oxycontin [oxycodone hcl]   REVIEW OF SYSTEMS  Negative except as noted here or in the History of Present Illness.   PHYSICAL EXAMINATION  Initial Vital Signs Blood pressure (!) 144/93, pulse 99, temperature 98.2 F (36.8 C), temperature source Oral, resp. rate 18, height 5\' 4"  (1.626 m), weight 74.8 kg (165 lb), SpO2 100 %.  Examination General: Well-developed, well-nourished female in no acute distress; appearance consistent with age of record HENT: normocephalic; atraumatic; nasal congestion; TMs normal Eyes: pupils equal, round and reactive to light; extraocular muscles intact Neck: supple; anterior cervical lymphadenopathy Heart: regular rate and rhythm Lungs: clear to auscultation bilaterally Abdomen: soft; nondistended; nontender;  bowel sounds present  Extremities: No deformity; full range of motion; pulses normal Neurologic: Awake, alert and oriented; motor function intact in all extremities and symmetric; no facial droop Skin: Warm and dry Psychiatric: Normal mood and affect   RESULTS  Summary of this visit's results, reviewed by myself:   EKG Interpretation  Date/Time:    Ventricular Rate:    PR Interval:    QRS Duration:   QT Interval:    QTC Calculation:   R Axis:      Text Interpretation:        Laboratory Studies: No results found for this or any previous visit (from the past 24 hour(s)). Imaging Studies: No results found.  ED COURSE  Nursing notes and initial vitals signs, including pulse oximetry, reviewed.  Vitals:   02/15/17 0143 02/15/17 0144  BP: (!) 144/93   Pulse: 99   Resp: 18   Temp: 98.2 F (36.8 C)   TempSrc: Oral   SpO2: 100%   Weight:  74.8 kg (165 lb)  Height:  5\' 4"  (1.626 m)    PROCEDURES    ED DIAGNOSES     ICD-10-CM   1. Nasal congestion R09.81        Niyonna Betsill, MD 02/15/17 0202

## 2017-02-15 NOTE — ED Notes (Signed)
Snack given with meds per pt request. D/c instructions given at length to pt. Many questions pt had were answered by this RN. Voiced understanding.

## 2017-02-15 NOTE — ED Triage Notes (Signed)
Pt c/o nasal congestion and "pressure" to her ears bilateral. States she has taken muccinex pta. States she has had a little cough. Denies any fevers. C/o left sided h/a. resp even and unlabored. States she was seen 2 weeks ago for same type symptoms and treated with an antibiotic.

## 2017-03-16 ENCOUNTER — Emergency Department (HOSPITAL_BASED_OUTPATIENT_CLINIC_OR_DEPARTMENT_OTHER)
Admission: EM | Admit: 2017-03-16 | Discharge: 2017-03-16 | Disposition: A | Discharge status: Home / Self Care | Payer: BLUE CROSS/BLUE SHIELD | Attending: Emergency Medicine | Admitting: Emergency Medicine

## 2017-03-16 ENCOUNTER — Encounter (HOSPITAL_BASED_OUTPATIENT_CLINIC_OR_DEPARTMENT_OTHER): Payer: Self-pay

## 2017-03-16 DIAGNOSIS — I10 Essential (primary) hypertension: Secondary | ICD-10-CM | POA: Diagnosis not present

## 2017-03-16 DIAGNOSIS — N760 Acute vaginitis: Secondary | ICD-10-CM | POA: Diagnosis not present

## 2017-03-16 DIAGNOSIS — Z7984 Long term (current) use of oral hypoglycemic drugs: Secondary | ICD-10-CM | POA: Diagnosis not present

## 2017-03-16 DIAGNOSIS — E039 Hypothyroidism, unspecified: Secondary | ICD-10-CM | POA: Insufficient documentation

## 2017-03-16 DIAGNOSIS — M545 Low back pain, unspecified: Secondary | ICD-10-CM

## 2017-03-16 DIAGNOSIS — Z87891 Personal history of nicotine dependence: Secondary | ICD-10-CM | POA: Diagnosis not present

## 2017-03-16 DIAGNOSIS — B9689 Other specified bacterial agents as the cause of diseases classified elsewhere: Secondary | ICD-10-CM

## 2017-03-16 DIAGNOSIS — Z79899 Other long term (current) drug therapy: Secondary | ICD-10-CM | POA: Insufficient documentation

## 2017-03-16 DIAGNOSIS — E119 Type 2 diabetes mellitus without complications: Secondary | ICD-10-CM | POA: Insufficient documentation

## 2017-03-16 LAB — URINALYSIS, ROUTINE W REFLEX MICROSCOPIC
BILIRUBIN URINE: NEGATIVE
GLUCOSE, UA: 100 mg/dL — AB
Hgb urine dipstick: NEGATIVE
KETONES UR: NEGATIVE mg/dL
Leukocytes, UA: NEGATIVE
NITRITE: NEGATIVE
PH: 5 (ref 5.0–8.0)
Protein, ur: NEGATIVE mg/dL
Specific Gravity, Urine: 1.019 (ref 1.005–1.030)

## 2017-03-16 LAB — PREGNANCY, URINE: Preg Test, Ur: NEGATIVE

## 2017-03-16 LAB — WET PREP, GENITAL
SPERM: NONE SEEN
Trich, Wet Prep: NONE SEEN

## 2017-03-16 MED ORDER — METRONIDAZOLE 500 MG PO TABS: 500.0000 mg | ORAL_TABLET | Freq: Two times a day (BID) | ORAL | 0 refills | Status: DC

## 2017-03-16 MED ORDER — FLUCONAZOLE 100 MG PO TABS
200.0000 mg | ORAL_TABLET | Freq: Once | ORAL | Status: AC
  Administered 2017-03-16: 200 mg via ORAL
  Filled 2017-03-16: qty 2

## 2017-03-16 MED ORDER — LIDOCAINE 4 % EX PTCH: 1.0000 | MEDICATED_PATCH | Freq: Every day | CUTANEOUS | 0 refills | Status: DC

## 2017-03-16 NOTE — ED Notes (Signed)
ED Provider at bedside. 

## 2017-03-16 NOTE — ED Provider Notes (Addendum)
[facility] Provider Note   CSN: [id number] Arrival date & time: 03/16/17  1206     History   Chief Complaint Chief Complaint  Patient presents with  . Back Pain    HPI Shawna Coleman is a 41 y.o. female.  Patient is a 41 year old female with a history of diabetes and hyperlipidemia who presents with back pain. She's had a 3 to four-day history of pain in her left mid back. It's more on the left side. She describes as a burning pain. It radiates down to her lumbar region. It's worse with movement. There is no radiation down the legs. No abdominal pain. No pleuritic pain. No shortness of breath. No known injuries to her back. No numbness or weakness to her extremities. No fevers. No urinary symptoms. No nausea or vomiting. She does have some vaginal discharge which she feels is unrelated to her back pain. She was seen 2 days ago at [facility] in emergency department. I reviewed these records and she had labs which included a chemistry, CBC and urinalysis which all appear to be normal. She also had a chest x-ray which was normal. She was given prescriptions for anti-inflammatory medicines and muscle relaxers but she doesn't feel that these are helping the symptoms.      Past Medical History:  Diagnosis Date  . Diabetes mellitus without complication (HCC)   . High cholesterol     Patient Active Problem List   Diagnosis Date Noted  . Hypertension 06/02/2016  . Left hip pain 06/02/2016  . Heartburn 04/08/2016  . Mixed hyperlipidemia 11/14/2015  . Uncontrolled type 2 diabetes mellitus without complication, with long-term current use of insulin (HCC) 11/14/2015  . Acquired hypothyroidism 11/14/2015    Past Surgical History:  Procedure Laterality Date  . ECTOPIC PREGNANCY SURGERY    . TUBAL LIGATION      OB History    No data available       Home Medications    Prior to Admission medications   Medication Sig Start Date End Date Taking?  Authorizing Provider  cyclobenzaprine (FLEXERIL) 10 MG tablet Take 1 tablet (10 mg total) by mouth 2 (two) times daily as needed for muscle spasms. 12/09/16   Tegeler, Canary Brim, MD  glipiZIDE (GLUCOTROL XL) 10 MG 24 hr tablet Take by mouth.    [provider]  Insulin Aspart (NOVOLOG [place]) Inject into the skin.    [provider]  Lidocaine 4 % PTCH Apply 1 patch topically daily. 03/16/17   Rolan Bucco, MD  metFORMIN (GLUCOPHAGE) 850 MG tablet Take by mouth.    [provider]  metroNIDAZOLE (FLAGYL) 500 MG tablet Take 1 tablet (500 mg total) by mouth 2 (two) times daily. One po bid x 7 days 03/16/17   Rolan Bucco, MD  ondansetron (ZOFRAN) 4 MG tablet Take 1 tablet (4 mg total) by mouth every 8 (eight) hours as needed for nausea or vomiting. 12/09/16   Tegeler, Canary Brim, MD  pravastatin (PRAVACHOL) 20 MG tablet Take by mouth.    [provider]    Family History No family history on file.  Social History Social History  Substance Use Topics  . Smoking status: Former Games developer  . Smokeless tobacco: Never Used  . Alcohol use No     Allergies   Etodolac; Macrolides and ketolides; Meloxicam; and Oxycontin [oxycodone hcl]   Review of Systems Review of Systems  Constitutional: Negative for chills, diaphoresis, fatigue and fever.  HENT: Negative for  congestion, rhinorrhea and sneezing.   Eyes: Negative.   Respiratory: Negative for cough, chest tightness and shortness of breath.   Cardiovascular: Negative for chest pain and leg swelling.  Gastrointestinal: Negative for abdominal pain, blood in stool, diarrhea, nausea and vomiting.  Genitourinary: Negative for difficulty urinating, flank pain, frequency and hematuria.  Musculoskeletal: Positive for back pain. Negative for arthralgias.  Skin: Negative for rash.  Neurological: Negative for dizziness, speech difficulty, weakness, numbness and headaches.     Physical Exam Updated Vital Signs BP  112/76 (BP Location: Right Arm)   Pulse 76   Temp 98.5 F (36.9 C) (Oral)   Resp 18   Ht 5' 4.5" (1.638 m)   Wt 74.8 kg (165 lb)   LMP 03/07/2017   SpO2 100%   BMI 27.88 kg/m   Physical Exam  Constitutional: She is oriented to person, place, and time. She appears well-developed and well-nourished.  HENT:  Head: Normocephalic and atraumatic.  Eyes: Pupils are equal, round, and reactive to light.  Neck: Normal range of motion. Neck supple.  Cardiovascular: Normal rate, regular rhythm and normal heart sounds.   Pulmonary/Chest: Effort normal and breath sounds normal. No respiratory distress. She has no wheezes. She has no rales. She exhibits no tenderness.  Abdominal: Soft. Bowel sounds are normal. There is no tenderness. There is no rebound and no guarding.  Genitourinary:  Genitourinary Comments: Positive thick white vaginal discharge. No cervical motion or adnexal tenderness  Musculoskeletal: Normal range of motion. She exhibits no edema.  Patient has mild tenderness on exam to the left mid back. There is no rash. No spinal tenderness.  Lymphadenopathy:    She has no cervical adenopathy.  Neurological: She is alert and oriented to person, place, and time.  Negative straight leg raise bilaterally, she has normal motor function and sensation to the lower extremities. Pedal pulses are intact.  Skin: Skin is warm and dry. No rash noted.  Psychiatric: She has a normal mood and affect.     ED Treatments / Results  Labs (all labs ordered are listed, but only abnormal results are displayed) Labs Reviewed  WET PREP, GENITAL - Abnormal; Notable for the following:       Result Value   Yeast Wet Prep HPF POC PRESENT (*)    Clue Cells Wet Prep HPF POC PRESENT (*)    WBC, Wet Prep HPF POC MANY (*)    All other components within normal limits  URINALYSIS, ROUTINE W REFLEX MICROSCOPIC - Abnormal; Notable for the following:    Glucose, UA 100 (*)    All other components within normal  limits  PREGNANCY, URINE  GC/CHLAMYDIA PROBE AMP ([place]) NOT AT [facility]    EKG  EKG Interpretation None       Radiology No results found.  Procedures Procedures (including critical care time)  Medications Ordered in ED Medications  fluconazole (DIFLUCAN) tablet 200 mg (not administered)     Initial Impression / Assessment and Plan / ED Course  I have reviewed the triage vital signs and the nursing notes.  Pertinent labs & imaging results that were available during my care of the patient were reviewed by me and considered in my medical decision making (see chart for details).     Patient presents with pain in her left mid back. There is no radicular symptoms. She's neurologically intact. No associated abdominal pain. No other symptoms that would be more consistent with pulmonary embolus. There is no rash or skin changes noted.  I feel this is likely muscular in nature. She is currently using a muscle relaxer and anti-inflammatory medications. She doesn't want any stronger medications. I will give her a prescription for lidocaine patches. She has made herself a follow-up appointment with an orthopedist on August 13. She also has evidence of bacterial vaginosis and a candidal infection. She was given a dose of Diflucan in the ED and prescriptions for Flagyl. Return precautions were given.  Final Clinical Impressions(s) / ED Diagnoses   Final diagnoses:  Acute left-sided low back pain without sciatica  BV (bacterial vaginosis)    New Prescriptions New Prescriptions   LIDOCAINE 4 % PTCH    Apply 1 patch topically daily.   METRONIDAZOLE (FLAGYL) 500 MG TABLET    Take 1 tablet (500 mg total) by mouth 2 (two) times daily. One po bid x 7 days     Rolan BuccoBelfi, Damoni Causby, MD 03/16/17 1541    Rolan BuccoBelfi, Arleigh Odowd, MD 03/16/17 [phone]

## 2017-03-16 NOTE — ED Triage Notes (Signed)
C/o left side back/flank pain x 5 days-[facility]-c/o cont'd "charlie horse feeling" to area-NAD-steady gait

## 2017-03-17 LAB — GC/CHLAMYDIA PROBE AMP (~~LOC~~) NOT AT ARMC
Chlamydia: NEGATIVE
Neisseria Gonorrhea: NEGATIVE

## 2017-05-26 ENCOUNTER — Encounter (HOSPITAL_BASED_OUTPATIENT_CLINIC_OR_DEPARTMENT_OTHER): Payer: Self-pay

## 2017-05-26 ENCOUNTER — Emergency Department (HOSPITAL_BASED_OUTPATIENT_CLINIC_OR_DEPARTMENT_OTHER)
Admission: EM | Admit: 2017-05-26 | Discharge: 2017-05-26 | Disposition: A | Discharge status: Home / Self Care | Payer: BLUE CROSS/BLUE SHIELD | Attending: Emergency Medicine | Admitting: Emergency Medicine

## 2017-05-26 DIAGNOSIS — Z794 Long term (current) use of insulin: Secondary | ICD-10-CM | POA: Diagnosis not present

## 2017-05-26 DIAGNOSIS — Z87891 Personal history of nicotine dependence: Secondary | ICD-10-CM | POA: Insufficient documentation

## 2017-05-26 DIAGNOSIS — L989 Disorder of the skin and subcutaneous tissue, unspecified: Secondary | ICD-10-CM | POA: Diagnosis present

## 2017-05-26 DIAGNOSIS — E119 Type 2 diabetes mellitus without complications: Secondary | ICD-10-CM | POA: Diagnosis not present

## 2017-05-26 DIAGNOSIS — L739 Follicular disorder, unspecified: Secondary | ICD-10-CM | POA: Diagnosis not present

## 2017-05-26 DIAGNOSIS — I10 Essential (primary) hypertension: Secondary | ICD-10-CM | POA: Diagnosis not present

## 2017-05-26 MED ORDER — SELENIUM SULFIDE 2.5 % EX LOTN: TOPICAL_LOTION | CUTANEOUS | 0 refills | Status: AC

## 2017-05-26 MED ORDER — CLINDAMYCIN HCL 150 MG PO CAPS: 300.0000 mg | ORAL_CAPSULE | Freq: Four times a day (QID) | ORAL | 0 refills | Status: DC

## 2017-05-26 MED ORDER — FLUCONAZOLE 150 MG PO TABS: 150.0000 mg | ORAL_TABLET | Freq: Every day | ORAL | 0 refills | Status: AC

## 2017-05-26 NOTE — Discharge Instructions (Signed)
It was my pleasure taking care of you today!   Please take all of your antibiotics until finished!  Apply shampoo as directed.   Return to ER for new or worsening symptoms, any additional concerns.

## 2017-05-26 NOTE — ED Notes (Signed)
ED Provider at bedside. 

## 2017-05-26 NOTE — ED Triage Notes (Signed)
Pt states she thought she had something that felt like pimple to scalp 2 days ago-pt "popped it"-area is now worse-NAD-steady gait

## 2017-05-26 NOTE — ED Provider Notes (Signed)
[facility] EMERGENCY DEPARTMENT Provider Note   CSN: [id number] Arrival date & time: 05/26/17  1853     History   Chief Complaint Chief Complaint  Patient presents with  . Hair/Scalp Problem    HPI Shawna Coleman is a 41 y.o. female.  The history is provided by the patient and medical records. No language interpreter was used.   Shawna Coleman is a 41 y.o. female  with a PMH of DM, HLD who presents to the Emergency Department complaining of bumps to the scalp x 2 days. Initially, she only felt one bump which she thought was a small pimple, but yesterday she noticed several other painful bumps around the initial area of concern. She applied neosporin to the area with no improvement. No contacts with similar. No knew shampoo or hair products. No recent hair cuts or treatments. Pain worse with palpation. No alleviating factors noted. No fever, chills, neck pain.  Past Medical History:  Diagnosis Date  . Diabetes mellitus without complication (HCC)   . High cholesterol     Patient Active Problem List   Diagnosis Date Noted  . Hypertension 06/02/2016  . Left hip pain 06/02/2016  . Heartburn 04/08/2016  . Mixed hyperlipidemia 11/14/2015  . Uncontrolled type 2 diabetes mellitus without complication, with long-term current use of insulin (HCC) 11/14/2015  . Acquired hypothyroidism 11/14/2015    Past Surgical History:  Procedure Laterality Date  . ECTOPIC PREGNANCY SURGERY    . TUBAL LIGATION      OB History    No data available       Home Medications    Prior to Admission medications   Medication Sig Start Date End Date Taking? Authorizing Provider  clindamycin (CLEOCIN) 150 MG capsule Take 2 capsules (300 mg total) by mouth 4 (four) times daily. 05/26/17   Ward, Chase PicketJaime Pilcher, PA-C  glipiZIDE (GLUCOTROL XL) 10 MG 24 hr tablet Take by mouth.    [provider]  Insulin Aspart (NOVOLOG [place]) Inject into the skin.    [provider]    metFORMIN (GLUCOPHAGE) 850 MG tablet Take by mouth.    [provider]  pravastatin (PRAVACHOL) 20 MG tablet Take by mouth.    [provider]  selenium sulfide (SELSUN) 2.5 % shampoo Massage 5-10 mL into wet scalp. Leave on for 2-3 minutes, then rinse scalp and repeat.  Apply twice each week for the next 2-4 weeks. 05/26/17   Ward, Chase PicketJaime Pilcher, PA-C    Family History No family history on file.  Social History Social History  Substance Use Topics  . Smoking status: Former Games developermoker  . Smokeless tobacco: Never Used  . Alcohol use No     Allergies   Etodolac; Macrolides and ketolides; Meloxicam; and Oxycontin [oxycodone hcl]   Review of Systems Review of Systems  Constitutional: Negative for chills and fever.  HENT: Negative for congestion.   Musculoskeletal: Negative for neck pain.  Skin: Positive for wound.  Neurological: Negative for headaches.     Physical Exam Updated Vital Signs BP (!) 165/96 (BP Location: Right Arm)   Pulse 90   Temp 98.9 F (37.2 C) (Oral)   Resp 18   Ht 5' 4.5" (1.638 m)   Wt 74.8 kg (165 lb)   LMP 04/30/2017   SpO2 100%   BMI 27.88 kg/m   Physical Exam  Constitutional: She appears well-developed and well-nourished. No distress.  HENT:  Head: Normocephalic and atraumatic.  Multiple tender pinpoint pustules to the central scalp.  No abscess.   Neck: Neck supple.  Cardiovascular: Normal rate, regular rhythm and normal heart sounds.   No murmur heard. Pulmonary/Chest: Effort normal and breath sounds normal. No respiratory distress. She has no wheezes. She has no rales.  Musculoskeletal: Normal range of motion.  Neurological: She is alert.  Skin: Skin is warm and dry.  Nursing note and vitals reviewed.    ED Treatments / Results  Labs (all labs ordered are listed, but only abnormal results are displayed) Labs Reviewed - No data to display  EKG  EKG Interpretation None       Radiology No results  found.  Procedures Procedures (including critical care time)  Medications Ordered in ED Medications - No data to display   Initial Impression / Assessment and Plan / ED Course  I have reviewed the triage vital signs and the nursing notes.  Pertinent labs & imaging results that were available during my care of the patient were reviewed by me and considered in my medical decision making (see chart for details).    Shawna Coleman is a 41 y.o. female who presents to ED for multiple bumps to the scalp x 2-3 days. Exam c/w folliculitis. Will start on clindamycin and selsun shampoo. Patient with hx of recurrent yeast infections with ABX use. Rx for one dose diflucan given if needed. PCP follow up if symptoms persist. Return precautions discussed and all questions answered.  Final Clinical Impressions(s) / ED Diagnoses   Final diagnoses:  Folliculitis    New Prescriptions New Prescriptions   CLINDAMYCIN (CLEOCIN) 150 MG CAPSULE    Take 2 capsules (300 mg total) by mouth 4 (four) times daily.   SELENIUM SULFIDE (SELSUN) 2.5 % SHAMPOO    Massage 5-10 mL into wet scalp. Leave on for 2-3 minutes, then rinse scalp and repeat.  Apply twice each week for the next 2-4 weeks.     Ward, Chase Picket, PA-C 05/26/17 1945    Lavera Guise, MD 05/26/17 [phone]

## 2017-11-15 ENCOUNTER — Encounter (HOSPITAL_BASED_OUTPATIENT_CLINIC_OR_DEPARTMENT_OTHER): Payer: Self-pay | Admitting: *Deleted

## 2017-11-15 ENCOUNTER — Other Ambulatory Visit: Payer: Self-pay

## 2017-11-15 ENCOUNTER — Emergency Department (HOSPITAL_BASED_OUTPATIENT_CLINIC_OR_DEPARTMENT_OTHER): Payer: BLUE CROSS/BLUE SHIELD

## 2017-11-15 ENCOUNTER — Emergency Department (HOSPITAL_BASED_OUTPATIENT_CLINIC_OR_DEPARTMENT_OTHER)
Admission: EM | Admit: 2017-11-15 | Discharge: 2017-11-15 | Disposition: A | Discharge status: Home / Self Care | Payer: BLUE CROSS/BLUE SHIELD | Attending: Emergency Medicine | Admitting: Emergency Medicine

## 2017-11-15 DIAGNOSIS — J329 Chronic sinusitis, unspecified: Secondary | ICD-10-CM | POA: Diagnosis not present

## 2017-11-15 DIAGNOSIS — I10 Essential (primary) hypertension: Secondary | ICD-10-CM | POA: Diagnosis not present

## 2017-11-15 DIAGNOSIS — R0981 Nasal congestion: Secondary | ICD-10-CM | POA: Insufficient documentation

## 2017-11-15 DIAGNOSIS — Z794 Long term (current) use of insulin: Secondary | ICD-10-CM | POA: Insufficient documentation

## 2017-11-15 DIAGNOSIS — Z7902 Long term (current) use of antithrombotics/antiplatelets: Secondary | ICD-10-CM | POA: Diagnosis not present

## 2017-11-15 DIAGNOSIS — Z87891 Personal history of nicotine dependence: Secondary | ICD-10-CM | POA: Insufficient documentation

## 2017-11-15 DIAGNOSIS — R05 Cough: Secondary | ICD-10-CM | POA: Diagnosis present

## 2017-11-15 DIAGNOSIS — M791 Myalgia, unspecified site: Secondary | ICD-10-CM | POA: Diagnosis not present

## 2017-11-15 DIAGNOSIS — E039 Hypothyroidism, unspecified: Secondary | ICD-10-CM | POA: Diagnosis not present

## 2017-11-15 DIAGNOSIS — E119 Type 2 diabetes mellitus without complications: Secondary | ICD-10-CM | POA: Insufficient documentation

## 2017-11-15 MED ORDER — AMOXICILLIN 500 MG PO CAPS: 500.0000 mg | ORAL_CAPSULE | Freq: Three times a day (TID) | ORAL | 0 refills | Status: DC

## 2017-11-15 MED ORDER — FLUCONAZOLE 200 MG PO TABS: 200.0000 mg | ORAL_TABLET | Freq: Every day | ORAL | 0 refills | Status: AC

## 2017-11-15 MED ORDER — OXYMETAZOLINE HCL 0.05 % NA SOLN: 1.0000 | Freq: Two times a day (BID) | NASAL | 0 refills | Status: AC

## 2017-11-15 MED ORDER — BENZONATATE 100 MG PO CAPS: 100.0000 mg | ORAL_CAPSULE | Freq: Three times a day (TID) | ORAL | 0 refills | Status: DC | PRN

## 2017-11-15 NOTE — ED Triage Notes (Signed)
Pt reports cough and generalized aching x last Thursday. Denies any fevers.

## 2017-11-16 NOTE — ED Provider Notes (Signed)
[facility] EMERGENCY DEPARTMENT Provider Note   CSN: [id number] Arrival date & time: 11/15/17  0802     History   Chief Complaint Chief Complaint  Patient presents with  . Cough    HPI Shawna Coleman is a 42 y.o. female.  HPI   42 year old female with cough, generalized body aches and facial congestion.  Symptom onset Thursday.  Persistent since then.  No fevers or chills.  No shortness of breath.  No rash.  No ear pain.  Past Medical History:  Diagnosis Date  . Diabetes mellitus without complication (HCC)   . High cholesterol     Patient Active Problem List   Diagnosis Date Noted  . Hypertension 06/02/2016  . Left hip pain 06/02/2016  . Heartburn 04/08/2016  . Mixed hyperlipidemia 11/14/2015  . Uncontrolled type 2 diabetes mellitus without complication, with long-term current use of insulin (HCC) 11/14/2015  . Acquired hypothyroidism 11/14/2015    Past Surgical History:  Procedure Laterality Date  . ECTOPIC PREGNANCY SURGERY    . TUBAL LIGATION       OB History   None      Home Medications    Prior to Admission medications   Medication Sig Start Date End Date Taking? Authorizing Provider  amoxicillin (AMOXIL) 500 MG capsule Take 1 capsule (500 mg total) by mouth 3 (three) times daily. 11/15/17   Raeford RazorKohut, Meredeth Furber, MD  benzonatate (TESSALON) 100 MG capsule Take 1 capsule (100 mg total) by mouth every 8 (eight) hours as needed for cough. 11/15/17   Raeford RazorKohut, Yittel Emrich, MD  clindamycin (CLEOCIN) 150 MG capsule Take 2 capsules (300 mg total) by mouth 4 (four) times daily. 05/26/17   Ward, Chase PicketJaime Pilcher, PA-C  fluconazole (DIFLUCAN) 200 MG tablet Take 1 tablet (200 mg total) by mouth daily for 1 dose. Take on the day you finish antibiotics. 11/15/17 11/16/17  Raeford RazorKohut, Perris Conwell, MD  glipiZIDE (GLUCOTROL XL) 10 MG 24 hr tablet Take by mouth.    [provider]  Insulin Aspart (NOVOLOG [place]) Inject into the skin.    [provider]  metFORMIN (GLUCOPHAGE)  850 MG tablet Take by mouth.    [provider]  oxymetazoline (AFRIN NASAL SPRAY) 0.05 % nasal spray Place 1 spray into both nostrils 2 (two) times daily. 11/15/17   Raeford RazorKohut, Akram Kissick, MD  pravastatin (PRAVACHOL) 20 MG tablet Take by mouth.    [provider]  selenium sulfide (SELSUN) 2.5 % shampoo Massage 5-10 mL into wet scalp. Leave on for 2-3 minutes, then rinse scalp and repeat.  Apply twice each week for the next 2-4 weeks. 05/26/17   Ward, Chase PicketJaime Pilcher, PA-C    Family History History reviewed. No pertinent family history.  Social History Social History   Tobacco Use  . Smoking status: Former Games developermoker  . Smokeless tobacco: Never Used  Substance Use Topics  . Alcohol use: No  . Drug use: No     Allergies   Etodolac; Macrolides and ketolides; Meloxicam; and Oxycontin [oxycodone hcl]   Review of Systems Review of Systems  All systems reviewed and negative, other than as noted in HPI.   Physical Exam Updated Vital Signs BP (!) 152/84 (BP Location: Right Arm)   Pulse 86   Temp 98.6 F (37 C) (Oral)   Resp 16   Ht 5' 4.5" (1.638 m)   Wt 72.6 kg (160 lb)   LMP 11/14/2017   SpO2 97%   BMI 27.04 kg/m   Physical Exam  Constitutional: She appears  well-developed and well-nourished. No distress.  HENT:  Head: Normocephalic and atraumatic.  Maxillary sinus tenderness.  No overlying skin changes.  Oropharynx is clear.  Eyes: Conjunctivae are normal. Right eye exhibits no discharge. Left eye exhibits no discharge.  Neck: Neck supple.  Cardiovascular: Normal rate, regular rhythm and normal heart sounds. Exam reveals no gallop and no friction rub.  No murmur heard. Pulmonary/Chest: Effort normal and breath sounds normal. No respiratory distress.  Abdominal: Soft. She exhibits no distension. There is no tenderness.  Musculoskeletal: She exhibits no edema or tenderness.  Neurological: She is alert.  Skin: Skin is warm and dry.  Psychiatric: She has a normal  mood and affect. Her behavior is normal. Thought content normal.  Nursing note and vitals reviewed.    ED Treatments / Results  Labs (all labs ordered are listed, but only abnormal results are displayed) Labs Reviewed - No data to display  EKG None  Radiology Dg Chest 2 View  Result Date: 11/15/2017 CLINICAL DATA:  Cough and pain EXAM: CHEST - 2 VIEW COMPARISON:  March 14, 2017 FINDINGS: Lungs are clear. Heart size and pulmonary vascularity are normal. No adenopathy. No pneumothorax. No bone lesions. IMPRESSION: No edema or consolidation. Electronically Signed   By: Bretta Bang III M.D.   On: 11/15/2017 08:31    Procedures Procedures (including critical care time)  Medications Ordered in ED Medications - No data to display   Initial Impression / Assessment and Plan / ED Course  I have reviewed the triage vital signs and the nursing notes.  Pertinent labs & imaging results that were available during my care of the patient were reviewed by me and considered in my medical decision making (see chart for details).     42 year old female with sinusitis.  Possibly viral or allergic.  She is concerned for possible bacterial infection.  Clinically I doubt.  She is given a course of antibiotics but advised to not take it unless she feels like her symptoms and her responding to other therapies by the end of the week.  Final Clinical Impressions(s) / ED Diagnoses   Final diagnoses:  Sinusitis, unspecified chronicity, unspecified location    ED Discharge Orders        Ordered    amoxicillin (AMOXIL) 500 MG capsule  3 times daily     11/15/17 0912    fluconazole (DIFLUCAN) 200 MG tablet  Daily     11/15/17 0912    benzonatate (TESSALON) 100 MG capsule  Every 8 hours PRN     11/15/17 0912    oxymetazoline (AFRIN NASAL SPRAY) 0.05 % nasal spray  2 times daily     11/15/17 0912       Raeford Razor, MD 11/16/17 [phone]

## 2017-12-26 ENCOUNTER — Emergency Department (HOSPITAL_COMMUNITY)
Admission: EM | Admit: 2017-12-26 | Discharge: 2017-12-26 | Disposition: A | Discharge status: Home / Self Care | Payer: BLUE CROSS/BLUE SHIELD | Attending: Emergency Medicine | Admitting: Emergency Medicine

## 2017-12-26 DIAGNOSIS — Z5321 Procedure and treatment not carried out due to patient leaving prior to being seen by health care provider: Secondary | ICD-10-CM | POA: Insufficient documentation

## 2017-12-26 DIAGNOSIS — F10929 Alcohol use, unspecified with intoxication, unspecified: Secondary | ICD-10-CM | POA: Diagnosis present

## 2017-12-26 NOTE — ED Notes (Signed)
Bed: ZO10 Expected date:  Expected time:  Means of arrival:  Comments: 42 yo F/ETOH-Fall

## 2017-12-26 NOTE — ED Notes (Signed)
Pt wants leave  Providers notified  Wheel chair provided

## 2017-12-26 NOTE — ED Triage Notes (Signed)
Pt comes from home, fall etoh, pt is intoxicated. V/s on 130/100, hr 94, cbg 345 history of diabetes. rr 18, pt currently on a antibiotic. family on the way. Vomiting one episode.

## 2018-05-18 ENCOUNTER — Encounter (HOSPITAL_BASED_OUTPATIENT_CLINIC_OR_DEPARTMENT_OTHER): Payer: Self-pay | Admitting: Emergency Medicine

## 2018-05-18 ENCOUNTER — Emergency Department (HOSPITAL_BASED_OUTPATIENT_CLINIC_OR_DEPARTMENT_OTHER)
Admission: EM | Admit: 2018-05-18 | Discharge: 2018-05-18 | Disposition: A | Discharge status: Home / Self Care | Payer: BLUE CROSS/BLUE SHIELD | Attending: Emergency Medicine | Admitting: Emergency Medicine

## 2018-05-18 ENCOUNTER — Other Ambulatory Visit: Payer: Self-pay

## 2018-05-18 DIAGNOSIS — N898 Other specified noninflammatory disorders of vagina: Secondary | ICD-10-CM | POA: Insufficient documentation

## 2018-05-18 DIAGNOSIS — Z5321 Procedure and treatment not carried out due to patient leaving prior to being seen by health care provider: Secondary | ICD-10-CM | POA: Insufficient documentation

## 2018-05-18 LAB — URINALYSIS, ROUTINE W REFLEX MICROSCOPIC
BILIRUBIN URINE: NEGATIVE
Glucose, UA: NEGATIVE mg/dL
HGB URINE DIPSTICK: NEGATIVE
Ketones, ur: NEGATIVE mg/dL
Leukocytes, UA: NEGATIVE
Nitrite: NEGATIVE
PH: 5.5 (ref 5.0–8.0)
Protein, ur: NEGATIVE mg/dL
SPECIFIC GRAVITY, URINE: 1.015 (ref 1.005–1.030)

## 2018-05-18 LAB — PREGNANCY, URINE: Preg Test, Ur: NEGATIVE

## 2018-05-18 NOTE — ED Triage Notes (Signed)
Patient presents with co urinary freq, lower back pain, and noticed blood when wiping after urinating; states onset 2-3 days; nad noted ambulatory with steady gait.

## 2018-05-18 NOTE — ED Notes (Signed)
Pt c/o frequent urination with vaginal discharge

## 2018-05-18 NOTE — ED Notes (Signed)
Informed pt of delay in care

## 2018-05-18 NOTE — ED Notes (Signed)
EMT updated pt to delay and offered pt coffee

## 2018-06-06 ENCOUNTER — Encounter (HOSPITAL_BASED_OUTPATIENT_CLINIC_OR_DEPARTMENT_OTHER): Payer: Self-pay | Admitting: *Deleted

## 2018-06-06 ENCOUNTER — Other Ambulatory Visit: Payer: Self-pay

## 2018-06-06 ENCOUNTER — Emergency Department (HOSPITAL_BASED_OUTPATIENT_CLINIC_OR_DEPARTMENT_OTHER)
Admission: EM | Admit: 2018-06-06 | Discharge: 2018-06-06 | Disposition: A | Discharge status: Home / Self Care | Payer: Medicaid Other | Attending: Emergency Medicine | Admitting: Emergency Medicine

## 2018-06-06 DIAGNOSIS — N760 Acute vaginitis: Secondary | ICD-10-CM | POA: Insufficient documentation

## 2018-06-06 DIAGNOSIS — R350 Frequency of micturition: Secondary | ICD-10-CM | POA: Insufficient documentation

## 2018-06-06 DIAGNOSIS — E039 Hypothyroidism, unspecified: Secondary | ICD-10-CM | POA: Insufficient documentation

## 2018-06-06 DIAGNOSIS — Z87891 Personal history of nicotine dependence: Secondary | ICD-10-CM | POA: Insufficient documentation

## 2018-06-06 DIAGNOSIS — E119 Type 2 diabetes mellitus without complications: Secondary | ICD-10-CM | POA: Insufficient documentation

## 2018-06-06 DIAGNOSIS — Z7902 Long term (current) use of antithrombotics/antiplatelets: Secondary | ICD-10-CM | POA: Insufficient documentation

## 2018-06-06 DIAGNOSIS — M545 Low back pain: Secondary | ICD-10-CM | POA: Insufficient documentation

## 2018-06-06 DIAGNOSIS — B9689 Other specified bacterial agents as the cause of diseases classified elsewhere: Secondary | ICD-10-CM | POA: Insufficient documentation

## 2018-06-06 DIAGNOSIS — Z794 Long term (current) use of insulin: Secondary | ICD-10-CM | POA: Insufficient documentation

## 2018-06-06 LAB — URINALYSIS, ROUTINE W REFLEX MICROSCOPIC
Bilirubin Urine: NEGATIVE
Glucose, UA: >=500 mg/dL — AB
Ketones, ur: 15 mg/dL — AB
Leukocytes, UA: NEGATIVE
Nitrite: NEGATIVE
PROTEIN: NEGATIVE mg/dL
Specific Gravity, Urine: 1.02 (ref 1.005–1.030)
pH: 6 (ref 5.0–8.0)

## 2018-06-06 LAB — URINALYSIS, MICROSCOPIC (REFLEX)

## 2018-06-06 LAB — WET PREP, GENITAL
SPERM: NONE SEEN
TRICH WET PREP: NONE SEEN
Yeast Wet Prep HPF POC: NONE SEEN

## 2018-06-06 LAB — PREGNANCY, URINE: PREG TEST UR: NEGATIVE

## 2018-06-06 MED ORDER — METRONIDAZOLE 500 MG PO TABS: 500.0000 mg | ORAL_TABLET | Freq: Two times a day (BID) | ORAL | 0 refills | Status: DC

## 2018-06-06 MED FILL — metroNIDAZOLE 500 MG TABS: 500 | 7 days supply | Qty: 14 | Fill #0

## 2018-06-06 NOTE — ED Triage Notes (Signed)
Pt stated that she have upper back spasm x 3 days, lower back pain x 3 weeks, frequent urination but denies burning sensation and vaginal discomfort. White vaginal discharge for 1 week

## 2018-06-06 NOTE — ED Provider Notes (Signed)
[facility] EMERGENCY DEPARTMENT Provider Note   CSN: [id number] Arrival date & time: 06/06/18  0915     History   Chief Complaint Chief Complaint  Patient presents with  . Back pain, Perineal discomfort    HPI Shawna Coleman is a 42 y.o. female who presents the emergency department with chief complaint of suprapubic and low back pain.  She is a past medical history of insulin-dependent type 2 diabetes.  She states that over the past 2 days she has had worsening urinary frequency without dysuria or hematuria.  She also noticed pain in her lower back and has had some pain in her lower abdomen as well.  She says that she was not sure if she might have a urinary tract infection and wanted to rule that out because she has been constipated.  She states that she took a laxative 2 nights ago and did not have much relief she denies nausea, vomiting, fever, chills.  HPI  Past Medical History:  Diagnosis Date  . Diabetes mellitus without complication (HCC)   . High cholesterol     Patient Active Problem List   Diagnosis Date Noted  . Hypertension 06/02/2016  . Left hip pain 06/02/2016  . Heartburn 04/08/2016  . Mixed hyperlipidemia 11/14/2015  . Uncontrolled type 2 diabetes mellitus without complication, with long-term current use of insulin (HCC) 11/14/2015  . Acquired hypothyroidism 11/14/2015    Past Surgical History:  Procedure Laterality Date  . ECTOPIC PREGNANCY SURGERY    . EYE SURGERY     Bilateral cataract surgery  . TUBAL LIGATION       OB History    Gravida  2   Para      Term      Preterm      AB  1   Living  1     SAB      TAB      Ectopic  1   Multiple      Live Births               Home Medications    Prior to Admission medications   Medication Sig Start Date End Date Taking? Authorizing Provider  glipiZIDE (GLUCOTROL XL) 10 MG 24 hr tablet Take by mouth.   Yes [provider]  Insulin Aspart (NOVOLOG [place]) Inject  into the skin.   Yes [provider]  insulin lispro (HUMALOG) 100 UNIT/ML injection Inject into the skin 2 (two) times daily.   Yes [provider]  metFORMIN (GLUCOPHAGE) 850 MG tablet Take by mouth.   Yes [provider]  pravastatin (PRAVACHOL) 20 MG tablet Take by mouth.   Yes [provider]  amoxicillin (AMOXIL) 500 MG capsule Take 1 capsule (500 mg total) by mouth 3 (three) times daily. 11/15/17   Raeford Razor, MD  benzonatate (TESSALON) 100 MG capsule Take 1 capsule (100 mg total) by mouth every 8 (eight) hours as needed for cough. 11/15/17   Raeford Razor, MD  clindamycin (CLEOCIN) 150 MG capsule Take 2 capsules (300 mg total) by mouth 4 (four) times daily. 05/26/17   Ward, Chase Picket, PA-C  oxymetazoline (AFRIN NASAL SPRAY) 0.05 % nasal spray Place 1 spray into both nostrils 2 (two) times daily. 11/15/17   Raeford Razor, MD  selenium sulfide (SELSUN) 2.5 % shampoo Massage 5-10 mL into wet scalp. Leave on for 2-3 minutes, then rinse scalp and repeat.  Apply twice each week for the next 2-4 weeks. 05/26/17  Ward, Chase Picket, PA-C    Family History Family History  Problem Relation Age of Onset  . Leukemia Mother   . Diabetes Father   . Heart failure Father   . Aneurysm Sister     Social History Social History   Tobacco Use  . Smoking status: Former Games developer  . Smokeless tobacco: Never Used  Substance Use Topics  . Alcohol use: No  . Drug use: No     Allergies   Etodolac; Macrolides and ketolides; Meloxicam; and Oxycontin [oxycodone hcl]   Review of Systems Review of Systems  Ten systems reviewed and are negative for acute change, except as noted in the HPI.   Physical Exam Updated Vital Signs BP 132/84   Pulse 87   Temp 98.3 F (36.8 C) (Oral)   Resp 18   LMP 06/01/2018   SpO2 97%   Physical Exam  Constitutional: She is oriented to person, place, and time. She appears well-developed and well-nourished. No distress.    HENT:  Head: Normocephalic and atraumatic.  Eyes: Conjunctivae are normal. No scleral icterus.  Neck: Normal range of motion.  Cardiovascular: Normal rate, regular rhythm and normal heart sounds. Exam reveals no gallop and no friction rub.  No murmur heard. Pulmonary/Chest: Effort normal and breath sounds normal. No respiratory distress.  Abdominal: Soft. Bowel sounds are normal. She exhibits no distension and no mass. There is tenderness. There is no guarding.  Suprapubic tenderness No CVA tenderness  Genitourinary:  Genitourinary Comments: Pelvic exam: normal external genitalia, vulva, vagina, cervix, uterus and adnexa.   Neurological: She is alert and oriented to person, place, and time.  Skin: Skin is warm and dry. She is not diaphoretic.  Psychiatric: Her behavior is normal.  Nursing note and vitals reviewed.    ED Treatments / Results  Labs (all labs ordered are listed, but only abnormal results are displayed) Labs Reviewed  URINALYSIS, ROUTINE W REFLEX MICROSCOPIC  PREGNANCY, URINE    EKG None  Radiology No results found.  Procedures Procedures (including critical care time)  Medications Ordered in ED Medications - No data to display   Initial Impression / Assessment and Plan / ED Course  I have reviewed the triage vital signs and the nursing notes.  Pertinent labs & imaging results that were available during my care of the patient were reviewed by me and considered in my medical decision making (see chart for details).  Clinical Course as of Jun 06 1952  Mon Jun 06, 2018  1239  Urine appears contaminated   [AH]    Clinical Course User Index [AH] Arthor Captain, PA-C   Here with flank and suprapubic pain.  The differential diagnosis of emergent flank pain includes, but is not limited to :Abdominal aortic aneurysm,, Renal artery embolism,Renal vein thrombosis, Aortic dissection, Mesenteric ischemia, Pyelonephritis, Renal infarction, Renal hemorrhage,  Nephrolithiasis/ Renal Colic, Bladder tumor,Cystitis, Biliary colic, Pancreatitis Perforated peptic ulcer Appendicitis ,Inguinal Hernia, Diverticulitis, Bowel obstruction Ectopic Pregnancy,PID/TOA,Ovarian cyst, Ovarian torsion Shingles Lower lobe pneumonia, Retroperitoneal hematoma/abscess/tumor, Epidural abscess, Epidural hematoma. Differential diagnosis of her lower abdominal considerations include pelvic inflammatory disease, ectopic pregnancy, appendicitis, urinary calculi, primary dysmenorrhea, septic abortion, ruptured ovarian cyst or tumor, ovarian torsion, tubo-ovarian abscess, degeneration of fibroid, endometriosis, diverticulitis, cystitis. She is extremely well-appearing and has a benign pelvic examination.  Her urine pregnancy is negative so I have no suspicion for heterotopic or ectopic pregnancy the patient is also in a monogamous relationship and has been married for many years and has no concern  for STI. Urine appears contaminated.  She is afebrile and hemodynamically stable.  She does not appear to have a urinary tract infection.  Her wet prep shows BV.  She is otherwise benign pelvic examination.  Patient will be discharged with Flagyl.  I discussed return precautions.  She appears appropriate for discharge at this time.  Final Clinical Impressions(s) / ED Diagnoses   Final diagnoses:  BV (bacterial vaginosis)    ED Discharge Orders    None       Arthor Captain, PA-C 06/06/18 1956    Tegeler, Canary Brim, MD 06/07/18 2252

## 2018-06-06 NOTE — Discharge Instructions (Addendum)
Contact a health care provider if: Your symptoms do not improve, even after treatment. You have more discharge or pain when urinating. You have a fever. You have pain in your abdomen. You have pain during sex. You have vaginal bleeding between periods. 

## 2018-09-18 ENCOUNTER — Encounter (HOSPITAL_BASED_OUTPATIENT_CLINIC_OR_DEPARTMENT_OTHER): Payer: Self-pay | Admitting: Emergency Medicine

## 2018-09-18 ENCOUNTER — Other Ambulatory Visit: Payer: Self-pay

## 2018-09-18 ENCOUNTER — Emergency Department (HOSPITAL_BASED_OUTPATIENT_CLINIC_OR_DEPARTMENT_OTHER)
Admission: EM | Admit: 2018-09-18 | Discharge: 2018-09-18 | Disposition: A | Discharge status: Home / Self Care | Payer: 59 | Attending: Emergency Medicine | Admitting: Emergency Medicine

## 2018-09-18 DIAGNOSIS — Z794 Long term (current) use of insulin: Secondary | ICD-10-CM | POA: Diagnosis not present

## 2018-09-18 DIAGNOSIS — Z79899 Other long term (current) drug therapy: Secondary | ICD-10-CM | POA: Insufficient documentation

## 2018-09-18 DIAGNOSIS — Z87891 Personal history of nicotine dependence: Secondary | ICD-10-CM | POA: Insufficient documentation

## 2018-09-18 DIAGNOSIS — I10 Essential (primary) hypertension: Secondary | ICD-10-CM | POA: Insufficient documentation

## 2018-09-18 DIAGNOSIS — N760 Acute vaginitis: Secondary | ICD-10-CM | POA: Insufficient documentation

## 2018-09-18 DIAGNOSIS — B9689 Other specified bacterial agents as the cause of diseases classified elsewhere: Secondary | ICD-10-CM

## 2018-09-18 DIAGNOSIS — E119 Type 2 diabetes mellitus without complications: Secondary | ICD-10-CM | POA: Insufficient documentation

## 2018-09-18 DIAGNOSIS — E039 Hypothyroidism, unspecified: Secondary | ICD-10-CM | POA: Insufficient documentation

## 2018-09-18 DIAGNOSIS — Z202 Contact with and (suspected) exposure to infections with a predominantly sexual mode of transmission: Secondary | ICD-10-CM | POA: Insufficient documentation

## 2018-09-18 LAB — URINALYSIS, ROUTINE W REFLEX MICROSCOPIC
Bilirubin Urine: NEGATIVE
Glucose, UA: >=500 mg/dL — AB
HGB URINE DIPSTICK: NEGATIVE
Ketones, ur: NEGATIVE mg/dL
Leukocytes, UA: NEGATIVE
NITRITE: NEGATIVE
PROTEIN: NEGATIVE mg/dL
Specific Gravity, Urine: 1.02 (ref 1.005–1.030)
pH: 5.5 (ref 5.0–8.0)

## 2018-09-18 LAB — PREGNANCY, URINE: PREG TEST UR: NEGATIVE

## 2018-09-18 LAB — URINALYSIS, MICROSCOPIC (REFLEX)

## 2018-09-18 LAB — WET PREP, GENITAL
Sperm: NONE SEEN
Trich, Wet Prep: NONE SEEN
Yeast Wet Prep HPF POC: NONE SEEN

## 2018-09-18 MED ORDER — CEFTRIAXONE SODIUM 250 MG IJ SOLR
250.0000 mg | Freq: Once | INTRAMUSCULAR | Status: AC
Start: 2018-09-18 — End: 2018-09-18
  Administered 2018-09-18: 250 mg via INTRAMUSCULAR
  Filled 2018-09-18: qty 250

## 2018-09-18 MED ORDER — LIDOCAINE HCL (PF) 1 % IJ SOLN
INTRAMUSCULAR | Status: AC
  Administered 2018-09-18: 5 mL
  Filled 2018-09-18: qty 5

## 2018-09-18 MED ORDER — AZITHROMYCIN 250 MG PO TABS
1000.0000 mg | ORAL_TABLET | Freq: Once | ORAL | Status: AC
  Administered 2018-09-18: 1000 mg via ORAL
  Filled 2018-09-18: qty 4

## 2018-09-18 MED ORDER — METRONIDAZOLE 500 MG PO TABS: 500.0000 mg | ORAL_TABLET | Freq: Two times a day (BID) | ORAL | 0 refills | Status: DC

## 2018-09-18 MED ORDER — FLUCONAZOLE 150 MG PO TABS: 150.0000 mg | ORAL_TABLET | ORAL | 0 refills | Status: DC | PRN

## 2018-09-18 NOTE — Discharge Instructions (Signed)
We saw in the ER for possible exposure to STD.  Our results show that you have bacterial vaginosis, for which we are sending you home with antibiotics. Take the Diflucan as needed if you end up with a yeast infection.

## 2018-09-18 NOTE — ED Triage Notes (Signed)
Pt states her boyfriend has blood in his semen with possible STD exposure. She would like to be tested. Denies symptoms.

## 2018-09-18 NOTE — ED Provider Notes (Signed)
[facility] EMERGENCY DEPARTMENT Provider Note   CSN: [id number] Arrival date & time: 09/18/18  1007     History   Chief Complaint Chief Complaint  Patient presents with  . Exposure to STD    HPI Shawna Coleman is a 43 y.o. female.  HPI  43 year old female with history of diabetes and high cholesterol comes in with chief complaint of possible STD exposure.  She reports that her now ex-boyfriend called her and informed her that he was having blood in his semen.  Patient denies any pelvic pain, vaginal discharge.  She does want to be screened for HIV as well.  Past Medical History:  Diagnosis Date  . Diabetes mellitus without complication (HCC)   . High cholesterol     Patient Active Problem List   Diagnosis Date Noted  . Hypertension 06/02/2016  . Left hip pain 06/02/2016  . Heartburn 04/08/2016  . Mixed hyperlipidemia 11/14/2015  . Uncontrolled type 2 diabetes mellitus without complication, with long-term current use of insulin (HCC) 11/14/2015  . Acquired hypothyroidism 11/14/2015    Past Surgical History:  Procedure Laterality Date  . ECTOPIC PREGNANCY SURGERY    . EYE SURGERY     Bilateral cataract surgery  . TUBAL LIGATION       OB History    Gravida  2   Para      Term      Preterm      AB  1   Living  1     SAB      TAB      Ectopic  1   Multiple      Live Births               Home Medications    Prior to Admission medications   Medication Sig Start Date End Date Taking? Authorizing Provider  amoxicillin (AMOXIL) 500 MG capsule Take 1 capsule (500 mg total) by mouth 3 (three) times daily. 11/15/17   Raeford RazorKohut, Stephen, MD  benzonatate (TESSALON) 100 MG capsule Take 1 capsule (100 mg total) by mouth every 8 (eight) hours as needed for cough. 11/15/17   Raeford RazorKohut, Stephen, MD  clindamycin (CLEOCIN) 150 MG capsule Take 2 capsules (300 mg total) by mouth 4 (four) times daily. 05/26/17   Ward, Chase PicketJaime Pilcher, PA-C  fluconazole  (DIFLUCAN) 150 MG tablet Take 1 tablet (150 mg total) by mouth every 3 (three) days as needed. 09/18/18   Derwood KaplanNanavati, Joyceline Maiorino, MD  glipiZIDE (GLUCOTROL XL) 10 MG 24 hr tablet Take by mouth.    [provider]  Insulin Aspart (NOVOLOG [place]) Inject into the skin.    [provider]  insulin lispro (HUMALOG) 100 UNIT/ML injection Inject into the skin 2 (two) times daily.    [provider]  metFORMIN (GLUCOPHAGE) 850 MG tablet Take by mouth.    [provider]  metroNIDAZOLE (FLAGYL) 500 MG tablet Take 1 tablet (500 mg total) by mouth 2 (two) times daily. One po bid x 7 days 09/18/18   Derwood KaplanNanavati, Oasis Goehring, MD  oxymetazoline (AFRIN NASAL SPRAY) 0.05 % nasal spray Place 1 spray into both nostrils 2 (two) times daily. 11/15/17   Raeford RazorKohut, Stephen, MD  pravastatin (PRAVACHOL) 20 MG tablet Take by mouth.    [provider]  selenium sulfide (SELSUN) 2.5 % shampoo Massage 5-10 mL into wet scalp. Leave on for 2-3 minutes, then rinse scalp and repeat.  Apply twice each week for the next 2-4 weeks. 05/26/17   Ward, Marijean NiemannJaime  Pilcher, PA-C    Family History Family History  Problem Relation Age of Onset  . Leukemia Mother   . Diabetes Father   . Heart failure Father   . Aneurysm Sister     Social History Social History   Tobacco Use  . Smoking status: Former Games developermoker  . Smokeless tobacco: Never Used  Substance Use Topics  . Alcohol use: No  . Drug use: No     Allergies   Etodolac; Macrolides and ketolides; Meloxicam; and Oxycontin [oxycodone hcl]   Review of Systems Review of Systems  Constitutional: Negative for activity change.  Genitourinary: Negative for dysuria, vaginal bleeding and vaginal discharge.     Physical Exam Updated Vital Signs BP (!) 159/91 (BP Location: Right Arm)   Pulse 70   Temp 98.1 F (36.7 C) (Oral)   Resp 18   Ht 5\' 4"  (1.626 m)   Wt 74.8 kg   LMP 08/28/2018   SpO2 100%   BMI 28.32 kg/m   Physical Exam Vitals signs and nursing  note reviewed. Exam conducted with a chaperone present.  Constitutional:      Appearance: She is well-developed.  HENT:     Head: Normocephalic and atraumatic.  Eyes:     Conjunctiva/sclera: Conjunctivae normal.     Pupils: Pupils are equal, round, and reactive to light.  Neck:     Musculoskeletal: Normal range of motion and neck supple.  Cardiovascular:     Rate and Rhythm: Normal rate and regular rhythm.     Heart sounds: Normal heart sounds. No murmur.  Pulmonary:     Effort: Pulmonary effort is normal. No respiratory distress.     Breath sounds: No wheezing.  Abdominal:     General: Bowel sounds are normal. There is no distension.     Palpations: Abdomen is soft.     Tenderness: There is no abdominal tenderness. There is no guarding or rebound.  Genitourinary:    Vagina: Normal.     Comments: External exam - normal, no lesions Speculum exam: Pt has some white discharge, no blood  Skin:    General: Skin is warm and dry.  Neurological:     Mental Status: She is alert and oriented to person, place, and time.      ED Treatments / Results  Labs (all labs ordered are listed, but only abnormal results are displayed) Labs Reviewed  WET PREP, GENITAL - Abnormal; Notable for the following components:      Result Value   Clue Cells Wet Prep HPF POC PRESENT (*)    WBC, Wet Prep HPF POC MODERATE (*)    All other components within normal limits  URINALYSIS, ROUTINE W REFLEX MICROSCOPIC - Abnormal; Notable for the following components:   Glucose, UA >=500 (*)    All other components within normal limits  URINALYSIS, MICROSCOPIC (REFLEX) - Abnormal; Notable for the following components:   Bacteria, UA RARE (*)    All other components within normal limits  PREGNANCY, URINE  HIV ANTIBODY (ROUTINE TESTING W REFLEX)  GC/CHLAMYDIA PROBE AMP ([place]) NOT AT [facility]    EKG None  Radiology No results found.  Procedures Procedures (including critical care time)  Medications  Ordered in ED Medications  azithromycin (ZITHROMAX) tablet 1,000 mg (1,000 mg Oral Given 09/18/18 1122)  cefTRIAXone (ROCEPHIN) injection 250 mg (250 mg Intramuscular Given 09/18/18 1122)  lidocaine (PF) (XYLOCAINE) 1 % injection (5 mLs  Given 09/18/18 1122)     Initial Impression / Assessment and  Plan / ED Course  I have reviewed the triage vital signs and the nursing notes.  Pertinent labs & imaging results that were available during my care of the patient were reviewed by me and considered in my medical decision making (see chart for details).     Patient comes in with chief complaint of possible STD exposure.  We will treat her with ceftriaxone and azithromycin.  On exam she is noted to have BV.  It appears to me that patient is not truly symptomatic, we discussed these findings with the patient and she has been given prescription in case she starts having heavy vaginal discharge.  Otherwise she will not take the medication that is prescribed.  Final Clinical Impressions(s) / ED Diagnoses   Final diagnoses:  Possible exposure to STD  BV (bacterial vaginosis)    ED Discharge Orders         Ordered    fluconazole (DIFLUCAN) 150 MG tablet  Every 72 hours PRN     09/18/18 1248    metroNIDAZOLE (FLAGYL) 500 MG tablet  2 times daily     09/18/18 1249           Derwood Kaplan, MD 09/18/18 1457

## 2018-09-19 LAB — GC/CHLAMYDIA PROBE AMP (~~LOC~~) NOT AT ARMC
CHLAMYDIA, DNA PROBE: NEGATIVE
Neisseria Gonorrhea: NEGATIVE

## 2018-09-19 LAB — HIV ANTIBODY (ROUTINE TESTING W REFLEX): HIV SCREEN 4TH GENERATION: NONREACTIVE

## 2019-01-30 ENCOUNTER — Other Ambulatory Visit: Payer: Self-pay

## 2019-01-30 ENCOUNTER — Emergency Department (HOSPITAL_BASED_OUTPATIENT_CLINIC_OR_DEPARTMENT_OTHER)
Admission: EM | Admit: 2019-01-30 | Discharge: 2019-01-30 | Disposition: A | Discharge status: Home / Self Care | Payer: 59 | Attending: Emergency Medicine | Admitting: Emergency Medicine

## 2019-01-30 ENCOUNTER — Encounter (HOSPITAL_BASED_OUTPATIENT_CLINIC_OR_DEPARTMENT_OTHER): Payer: Self-pay | Admitting: *Deleted

## 2019-01-30 DIAGNOSIS — N949 Unspecified condition associated with female genital organs and menstrual cycle: Secondary | ICD-10-CM

## 2019-01-30 DIAGNOSIS — E039 Hypothyroidism, unspecified: Secondary | ICD-10-CM | POA: Diagnosis not present

## 2019-01-30 DIAGNOSIS — R102 Pelvic and perineal pain: Secondary | ICD-10-CM | POA: Insufficient documentation

## 2019-01-30 DIAGNOSIS — Z794 Long term (current) use of insulin: Secondary | ICD-10-CM | POA: Insufficient documentation

## 2019-01-30 DIAGNOSIS — I10 Essential (primary) hypertension: Secondary | ICD-10-CM | POA: Diagnosis not present

## 2019-01-30 DIAGNOSIS — N898 Other specified noninflammatory disorders of vagina: Secondary | ICD-10-CM | POA: Diagnosis present

## 2019-01-30 DIAGNOSIS — Z87891 Personal history of nicotine dependence: Secondary | ICD-10-CM | POA: Insufficient documentation

## 2019-01-30 DIAGNOSIS — E119 Type 2 diabetes mellitus without complications: Secondary | ICD-10-CM | POA: Insufficient documentation

## 2019-01-30 DIAGNOSIS — Z711 Person with feared health complaint in whom no diagnosis is made: Secondary | ICD-10-CM

## 2019-01-30 LAB — RAPID HIV SCREEN (HIV 1/2 AB+AG)
HIV 1/2 Antibodies: NONREACTIVE
HIV-1 P24 Antigen - HIV24: NONREACTIVE

## 2019-01-30 LAB — URINALYSIS, MICROSCOPIC (REFLEX): RBC / HPF: NONE SEEN RBC/hpf (ref 0–5)

## 2019-01-30 LAB — WET PREP, GENITAL
Clue Cells Wet Prep HPF POC: NONE SEEN
Sperm: NONE SEEN
Trich, Wet Prep: NONE SEEN
Yeast Wet Prep HPF POC: NONE SEEN

## 2019-01-30 LAB — URINALYSIS, ROUTINE W REFLEX MICROSCOPIC
Bilirubin Urine: NEGATIVE
Glucose, UA: >=500 mg/dL — AB
Hgb urine dipstick: NEGATIVE
Ketones, ur: NEGATIVE mg/dL
Leukocytes,Ua: NEGATIVE
Nitrite: NEGATIVE
Protein, ur: NEGATIVE mg/dL
Specific Gravity, Urine: 1.015 (ref 1.005–1.030)
pH: 5.5 (ref 5.0–8.0)

## 2019-01-30 LAB — PREGNANCY, URINE: Preg Test, Ur: NEGATIVE

## 2019-01-30 MED ORDER — FLUCONAZOLE 200 MG PO TABS: 200.0000 mg | ORAL_TABLET | Freq: Every day | ORAL | 0 refills | Status: AC

## 2019-01-30 MED ORDER — DOXYCYCLINE HYCLATE 100 MG PO CAPS: 100.0000 mg | ORAL_CAPSULE | Freq: Two times a day (BID) | ORAL | 0 refills | Status: DC

## 2019-01-30 MED ORDER — DOXYCYCLINE HYCLATE 100 MG PO TABS
100.0000 mg | ORAL_TABLET | Freq: Once | ORAL | Status: AC
  Administered 2019-01-30: 10:00:00 100 mg via ORAL
  Filled 2019-01-30: qty 1

## 2019-01-30 MED ORDER — CEFTRIAXONE SODIUM 250 MG IJ SOLR
250.0000 mg | Freq: Once | INTRAMUSCULAR | Status: AC
  Administered 2019-01-30: 250 mg via INTRAMUSCULAR
  Filled 2019-01-30: qty 250

## 2019-01-30 NOTE — ED Notes (Signed)
Pelvic cart to bedside, supplies gathered.

## 2019-01-30 NOTE — ED Triage Notes (Signed)
Pt reports itching and burning to her external vaginal area. Pt states she has had some yellowish discharge.

## 2019-01-31 LAB — GC/CHLAMYDIA PROBE AMP (~~LOC~~) NOT AT ARMC
Chlamydia: NEGATIVE
Neisseria Gonorrhea: NEGATIVE

## 2019-02-02 ENCOUNTER — Telehealth (HOSPITAL_COMMUNITY): Payer: Self-pay

## 2019-02-05 NOTE — ED Provider Notes (Signed)
[facility] EMERGENCY DEPARTMENT Provider Note   CSN: [id number] Arrival date & time: 01/30/19  4098     History   Chief Complaint Chief Complaint  Patient presents with  . Vaginal Discharge    HPI Shawna Coleman is a 43 y.o. female.     HPI   43 year old female with vaginal itching/burning.  No abdominal pain.  No unusual bleeding.  Some yellowish discharge.  No dysuria, urgency or increased frequency.  No fevers or chills.  No nausea/vomiting.  Past Medical History:  Diagnosis Date  . Diabetes mellitus without complication ([place])   . High cholesterol     Patient Active Problem List   Diagnosis Date Noted  . Hypertension 06/02/2016  . Left hip pain 06/02/2016  . Heartburn 04/08/2016  . Mixed hyperlipidemia 11/14/2015  . Uncontrolled type 2 diabetes mellitus without complication, with long-term current use of insulin ([place]) 11/14/2015  . Acquired hypothyroidism 11/14/2015    Past Surgical History:  Procedure Laterality Date  . ECTOPIC PREGNANCY SURGERY    . EYE SURGERY     Bilateral cataract surgery  . TUBAL LIGATION       OB History    Gravida  2   Para      Term      Preterm      AB  1   Living  1     SAB      TAB      Ectopic  1   Multiple      Live Births               Home Medications    Prior to Admission medications   Medication Sig Start Date End Date Taking? Authorizing Provider  amoxicillin (AMOXIL) 500 MG capsule Take 1 capsule (500 mg total) by mouth 3 (three) times daily. 11/15/17   Virgel Manifold, MD  benzonatate (TESSALON) 100 MG capsule Take 1 capsule (100 mg total) by mouth every 8 (eight) hours as needed for cough. 11/15/17   Virgel Manifold, MD  clindamycin (CLEOCIN) 150 MG capsule Take 2 capsules (300 mg total) by mouth 4 (four) times daily. 05/26/17   Ward, Ozella Almond, PA-C  doxycycline (VIBRAMYCIN) 100 MG capsule Take 1 capsule (100 mg total) by mouth 2 (two) times daily. 01/30/19   Virgel Manifold, MD   glipiZIDE (GLUCOTROL XL) 10 MG 24 hr tablet Take by mouth.    [provider]  Insulin Aspart (NOVOLOG [place]) Inject into the skin.    [provider]  insulin lispro (HUMALOG) 100 UNIT/ML injection Inject into the skin 2 (two) times daily.    [provider]  metFORMIN (GLUCOPHAGE) 850 MG tablet Take by mouth.    [provider]  metroNIDAZOLE (FLAGYL) 500 MG tablet Take 1 tablet (500 mg total) by mouth 2 (two) times daily. One po bid x 7 days 09/18/18   Varney Biles, MD  oxymetazoline (AFRIN NASAL SPRAY) 0.05 % nasal spray Place 1 spray into both nostrils 2 (two) times daily. 11/15/17   Virgel Manifold, MD  pravastatin (PRAVACHOL) 20 MG tablet Take by mouth.    [provider]  selenium sulfide (SELSUN) 2.5 % shampoo Massage 5-10 mL into wet scalp. Leave on for 2-3 minutes, then rinse scalp and repeat.  Apply twice each week for the next 2-4 weeks. 05/26/17   Ward, Ozella Almond, PA-C    Family History Family History  Problem Relation Age of Onset  . Leukemia Mother   . Diabetes Father   .  Heart failure Father   . Aneurysm Sister     Social History Social History   Tobacco Use  . Smoking status: Former Games developermoker  . Smokeless tobacco: Never Used  Substance Use Topics  . Alcohol use: No  . Drug use: No     Allergies   Etodolac, Macrolides and ketolides, Meloxicam, and Oxycontin [oxycodone hcl]   Review of Systems Review of Systems  All systems reviewed and negative, other than as noted in HPI.  Physical Exam Updated Vital Signs BP 133/86 (BP Location: Right Arm)   Pulse 91   Temp 98.5 F (36.9 C) (Oral)   Resp 18   LMP 01/09/2019   SpO2 100%   Physical Exam Vitals signs and nursing note reviewed.  Constitutional:      General: She is not in acute distress.    Appearance: She is well-developed.  HENT:     Head: Normocephalic and atraumatic.  Eyes:     General:        Right eye: No discharge.        Left eye: No  discharge.     Conjunctiva/sclera: Conjunctivae normal.  Neck:     Musculoskeletal: Neck supple.  Cardiovascular:     Rate and Rhythm: Normal rate and regular rhythm.     Heart sounds: Normal heart sounds. No murmur. No friction rub. No gallop.   Pulmonary:     Effort: Pulmonary effort is normal. No respiratory distress.     Breath sounds: Normal breath sounds.  Abdominal:     General: There is no distension.     Palpations: Abdomen is soft.     Tenderness: There is no abdominal tenderness.  Musculoskeletal:        General: No tenderness.  Skin:    General: Skin is warm and dry.  Neurological:     Mental Status: She is alert.  Psychiatric:        Behavior: Behavior normal.        Thought Content: Thought content normal.      ED Treatments / Results  Labs (all labs ordered are listed, but only abnormal results are displayed) Labs Reviewed  WET PREP, GENITAL - Abnormal; Notable for the following components:      Result Value   WBC, Wet Prep HPF POC FEW (*)    All other components within normal limits  URINALYSIS, ROUTINE W REFLEX MICROSCOPIC - Abnormal; Notable for the following components:   Glucose, UA >=500 (*)    All other components within normal limits  URINALYSIS, MICROSCOPIC (REFLEX) - Abnormal; Notable for the following components:   Bacteria, UA RARE (*)    All other components within normal limits  PREGNANCY, URINE  RAPID HIV SCREEN (HIV 1/2 AB+AG)  GC/CHLAMYDIA PROBE AMP ([place]) NOT AT [facility]    EKG    Radiology No results found.  Procedures Procedures (including critical care time)  Medications Ordered in ED Medications  cefTRIAXone (ROCEPHIN) injection 250 mg (250 mg Intramuscular Given 01/30/19 1023)  doxycycline (VIBRA-TABS) tablet 100 mg (100 mg Oral Given 01/30/19 1023)     Initial Impression / Assessment and Plan / ED Course  I have reviewed the triage vital signs and the nursing notes.  Pertinent labs & imaging results that were  available during my care of the patient were reviewed by me and considered in my medical decision making (see chart for details).  Possible PID.  Afebrile.  Benign abdominal exam.  Will treat as such.  Return precautions  discussed.  Final Clinical Impressions(s) / ED Diagnoses   Final diagnoses:  Vaginal discomfort  Concern about STD in female without diagnosis    ED Discharge Orders         Ordered    doxycycline (VIBRAMYCIN) 100 MG capsule  2 times daily     01/30/19 1056    fluconazole (DIFLUCAN) 200 MG tablet  Daily     01/30/19 1056           Raeford RazorKohut, Rubert Frediani, MD 02/05/19 1124

## 2019-04-13 ENCOUNTER — Emergency Department (HOSPITAL_BASED_OUTPATIENT_CLINIC_OR_DEPARTMENT_OTHER)
Admission: EM | Admit: 2019-04-13 | Discharge: 2019-04-13 | Disposition: A | Discharge status: Home / Self Care | Payer: 59 | Attending: Emergency Medicine | Admitting: Emergency Medicine

## 2019-04-13 ENCOUNTER — Encounter (HOSPITAL_BASED_OUTPATIENT_CLINIC_OR_DEPARTMENT_OTHER): Payer: Self-pay | Admitting: Emergency Medicine

## 2019-04-13 ENCOUNTER — Other Ambulatory Visit: Payer: Self-pay

## 2019-04-13 DIAGNOSIS — E039 Hypothyroidism, unspecified: Secondary | ICD-10-CM | POA: Insufficient documentation

## 2019-04-13 DIAGNOSIS — B9689 Other specified bacterial agents as the cause of diseases classified elsewhere: Secondary | ICD-10-CM

## 2019-04-13 DIAGNOSIS — E119 Type 2 diabetes mellitus without complications: Secondary | ICD-10-CM | POA: Diagnosis not present

## 2019-04-13 DIAGNOSIS — Z794 Long term (current) use of insulin: Secondary | ICD-10-CM | POA: Insufficient documentation

## 2019-04-13 DIAGNOSIS — Z79899 Other long term (current) drug therapy: Secondary | ICD-10-CM | POA: Insufficient documentation

## 2019-04-13 DIAGNOSIS — R42 Dizziness and giddiness: Secondary | ICD-10-CM | POA: Diagnosis present

## 2019-04-13 DIAGNOSIS — Z87891 Personal history of nicotine dependence: Secondary | ICD-10-CM | POA: Insufficient documentation

## 2019-04-13 DIAGNOSIS — R350 Frequency of micturition: Secondary | ICD-10-CM | POA: Diagnosis not present

## 2019-04-13 DIAGNOSIS — R1030 Lower abdominal pain, unspecified: Secondary | ICD-10-CM | POA: Diagnosis not present

## 2019-04-13 DIAGNOSIS — N760 Acute vaginitis: Secondary | ICD-10-CM

## 2019-04-13 LAB — URINALYSIS, ROUTINE W REFLEX MICROSCOPIC
Bilirubin Urine: NEGATIVE
Glucose, UA: NEGATIVE mg/dL
Hgb urine dipstick: NEGATIVE
Ketones, ur: NEGATIVE mg/dL
Leukocytes,Ua: NEGATIVE
Nitrite: NEGATIVE
Protein, ur: NEGATIVE mg/dL
Specific Gravity, Urine: <1.005 — ABNORMAL LOW (ref 1.005–1.030)
pH: 6 (ref 5.0–8.0)

## 2019-04-13 LAB — BASIC METABOLIC PANEL
Anion gap: 7 (ref 5–15)
BUN: 8 mg/dL (ref 6–20)
CO2: 24 mmol/L (ref 22–32)
Calcium: 9 mg/dL (ref 8.9–10.3)
Chloride: 104 mmol/L (ref 98–111)
Creatinine, Ser: 0.73 mg/dL (ref 0.44–1.00)
GFR calc Af Amer: >60 mL/min (ref >60)
GFR calc non Af Amer: >60 mL/min (ref >60)
Glucose, Bld: 87 mg/dL (ref 70–99)
Potassium: 3.5 mmol/L (ref 3.5–5.1)
Sodium: 135 mmol/L (ref 135–145)

## 2019-04-13 LAB — CBC WITH DIFFERENTIAL/PLATELET
Abs Immature Granulocytes: 0.01 10*3/uL (ref 0.00–0.07)
Basophils Absolute: 0 10*3/uL (ref 0.0–0.1)
Basophils Relative: 0 %
Eosinophils Absolute: 0.1 10*3/uL (ref 0.0–0.5)
Eosinophils Relative: 1 %
HCT: 34.5 % — ABNORMAL LOW (ref 36.0–46.0)
Hemoglobin: 11.1 g/dL — ABNORMAL LOW (ref 12.0–15.0)
Immature Granulocytes: 0 %
Lymphocytes Relative: 28 %
Lymphs Abs: 1.6 10*3/uL (ref 0.7–4.0)
MCH: 30.1 pg (ref 26.0–34.0)
MCHC: 32.2 g/dL (ref 30.0–36.0)
MCV: 93.5 fL (ref 80.0–100.0)
Monocytes Absolute: 0.3 10*3/uL (ref 0.1–1.0)
Monocytes Relative: 6 %
Neutro Abs: 3.5 10*3/uL (ref 1.7–7.7)
Neutrophils Relative %: 65 %
Platelets: 366 10*3/uL (ref 150–400)
RBC: 3.69 MIL/uL — ABNORMAL LOW (ref 3.87–5.11)
RDW: 13.3 % (ref 11.5–15.5)
WBC: 5.5 10*3/uL (ref 4.0–10.5)
nRBC: 0 % (ref 0.0–0.2)

## 2019-04-13 LAB — WET PREP, GENITAL
Sperm: NONE SEEN
Trich, Wet Prep: NONE SEEN
Yeast Wet Prep HPF POC: NONE SEEN

## 2019-04-13 LAB — PREGNANCY, URINE: Preg Test, Ur: NEGATIVE

## 2019-04-13 MED ORDER — METRONIDAZOLE 0.75 % VA GEL: 1.0000 | Freq: Two times a day (BID) | VAGINAL | 0 refills | Status: AC

## 2019-04-13 MED FILL — metroNIDAZOLE 0.75 % GEL: 0.75 | 5 days supply | Qty: 70 | Fill #0

## 2019-04-13 NOTE — ED Triage Notes (Signed)
Pt c/o nausea and lower back pain x 3 weeks, also c/o dizziness that started this am. Pt reports glucose 193 pta. Denies fever, abdominal pain, mild pressure and frequent urination. Denies taking pain meds pta.Denies injury

## 2019-04-13 NOTE — ED Notes (Signed)
Pt. Reports she has started taking flagyl and bactrum as of yesterday thinking she may have a UTI

## 2019-04-13 NOTE — ED Provider Notes (Signed)
[facility] EMERGENCY DEPARTMENT Provider Note   CSN: [id number] Arrival date & time: 04/13/19  1205     History   Chief Complaint Chief Complaint  Patient presents with  . Dizziness  . Back Pain    HPI Shawna Coleman is a 43 y.o. female with a history of type 2 diabetes, high cholesterol, ectopic pregnancy status post tubal ligation, presenting to emergency department with complaint of lower abdominal pain and urinary frequency.  She reports progressive worsening of her symptoms for the past 3 weeks.   She nominal fullness and suprapubic pressure, and is frequently urinating, does not feel like she can fully finish urinating.  She began self-medicating with Bactrim 2 days ago and Flagyl 3 days ago for treatment for possible urinary tract infection or BV, both of which she has had before in the past.  She reports no improvement of her symptoms.  She reports feelings "sometimes hot" but denies having fevers or rigors at home.  She denies any foul-smelling vaginal discharge.  During my exam, we asked her boyfriend to step out of the room, and she reported to me in private that she wishes to be tested for STIs.  She would not elaborate further which he believes her risk factors have been.  She is also requesting HIV testing.  She reports 1 prior history of ED visit for DKA.  She does not feel like her symptoms today correspond with that visit.  She otherwise denies vomiting or diarrhea.  She is able to tolerate p.o. fluids and food.     HPI  Past Medical History:  Diagnosis Date  . Diabetes mellitus without complication (HCC)   . High cholesterol     Patient Active Problem List   Diagnosis Date Noted  . Hypertension 06/02/2016  . Left hip pain 06/02/2016  . Heartburn 04/08/2016  . Mixed hyperlipidemia 11/14/2015  . Uncontrolled type 2 diabetes mellitus without complication, with long-term current use of insulin (HCC) 11/14/2015  . Acquired hypothyroidism 11/14/2015     Past Surgical History:  Procedure Laterality Date  . ECTOPIC PREGNANCY SURGERY    . EYE SURGERY     Bilateral cataract surgery  . TUBAL LIGATION       OB History    Gravida  2   Para      Term      Preterm      AB  1   Living  1     SAB      TAB      Ectopic  1   Multiple      Live Births               Home Medications    Prior to Admission medications   Medication Sig Start Date End Date Taking? Authorizing Provider  amoxicillin (AMOXIL) 500 MG capsule Take 1 capsule (500 mg total) by mouth 3 (three) times daily. 11/15/17   Raeford RazorKohut, Stephen, MD  benzonatate (TESSALON) 100 MG capsule Take 1 capsule (100 mg total) by mouth every 8 (eight) hours as needed for cough. 11/15/17   Raeford RazorKohut, Stephen, MD  clindamycin (CLEOCIN) 150 MG capsule Take 2 capsules (300 mg total) by mouth 4 (four) times daily. 05/26/17   Ward, Chase PicketJaime Pilcher, PA-C  doxycycline (VIBRAMYCIN) 100 MG capsule Take 1 capsule (100 mg total) by mouth 2 (two) times daily. 01/30/19   Raeford RazorKohut, Stephen, MD  glipiZIDE (GLUCOTROL XL) 10 MG 24 hr tablet Take by mouth.    [provider]  Insulin Aspart (NOVOLOG [employer]) Inject into the skin.    [provider]  insulin lispro (HUMALOG) 100 UNIT/ML injection Inject into the skin 2 (two) times daily.    [provider]  metFORMIN (GLUCOPHAGE) 850 MG tablet Take by mouth.    [provider]  metroNIDAZOLE (FLAGYL) 500 MG tablet Take 1 tablet (500 mg total) by mouth 2 (two) times daily. One po bid x 7 days 09/18/18   Derwood Kaplan, MD  oxymetazoline (AFRIN NASAL SPRAY) 0.05 % nasal spray Place 1 spray into both nostrils 2 (two) times daily. 11/15/17   Raeford Razor, MD  pravastatin (PRAVACHOL) 20 MG tablet Take by mouth.    [provider]  selenium sulfide (SELSUN) 2.5 % shampoo Massage 5-10 mL into wet scalp. Leave on for 2-3 minutes, then rinse scalp and repeat.  Apply twice each week for the next 2-4 weeks. 05/26/17   Ward, Chase Picket, PA-C    Family History Family History  Problem Relation Age of Onset  . Leukemia Mother   . Diabetes Father   . Heart failure Father   . Aneurysm Sister     Social History Social History   Tobacco Use  . Smoking status: Former Games developer  . Smokeless tobacco: Never Used  Substance Use Topics  . Alcohol use: No  . Drug use: No     Allergies   Etodolac, Macrolides and ketolides, Meloxicam, and Oxycontin [oxycodone hcl]   Review of Systems Review of Systems  Constitutional: Negative for chills and fever.  HENT: Negative for ear pain.   Eyes: Negative for pain and visual disturbance.  Respiratory: Negative for cough and shortness of breath.   Gastrointestinal: Positive for abdominal pain and nausea. Negative for vomiting.  Genitourinary: Positive for flank pain and frequency. Negative for dyspareunia, dysuria, hematuria, pelvic pain, vaginal bleeding, vaginal discharge and vaginal pain.  Musculoskeletal: Positive for back pain and myalgias.  Skin: Negative for pallor and rash.  Neurological: Negative for seizures and syncope.  All other systems reviewed and are negative.    Physical Exam Updated Vital Signs BP (!) 157/87 (BP Location: Right Arm)   Pulse 92   Temp 98.3 F (36.8 C) (Oral)   Resp 16   Ht 5\' 5"  (1.651 m)   Wt 79.9 kg   LMP 04/02/2019 (Approximate)   SpO2 100%   BMI 29.31 kg/m   Physical Exam Vitals signs and nursing note reviewed.  Constitutional:      General: She is not in acute distress.    Appearance: She is well-developed.  HENT:     Head: Normocephalic and atraumatic.  Eyes:     Conjunctiva/sclera: Conjunctivae normal.  Neck:     Musculoskeletal: Neck supple.  Cardiovascular:     Rate and Rhythm: Normal rate and regular rhythm.     Pulses: Normal pulses.  Pulmonary:     Effort: Pulmonary effort is normal. No respiratory distress.     Breath sounds: Normal breath sounds.  Abdominal:     Palpations: Abdomen is soft.      Tenderness: There is no abdominal tenderness. There is no right CVA tenderness, left CVA tenderness, guarding or rebound. Negative signs include Murphy's sign, Rovsing's sign and McBurney's sign.     Comments: No over distended bladder on physical exam. Mild tenderness to palpation in the lower mid abdomen.  Genitourinary:    Comments: Exam performed with chaperone present. External: Normal external female genitalia. No lesions, rashes, drainage, or suspicious lymph  nodes. Internal: No CMT. Cervix closed and without erythema. Minimal physiologic lochia. No adnexal tenderness, swelling, or masses. No lacerations. No foreign bodies. Skin:    General: Skin is warm and dry.  Neurological:     General: No focal deficit present.     Mental Status: She is alert and oriented to person, place, and time.  Psychiatric:        Mood and Affect: Mood normal.        Behavior: Behavior normal.      ED Treatments / Results  Labs (all labs ordered are listed, but only abnormal results are displayed) Labs Reviewed  URINALYSIS, ROUTINE W REFLEX MICROSCOPIC  PREGNANCY, URINE    EKG None  Radiology No results found.  Procedures Procedures (including critical care time)  Medications Ordered in ED Medications - No data to display   Initial Impression / Assessment and Plan / ED Course  I have reviewed the triage vital signs and the nursing notes.  Pertinent labs & imaging results that were available during my care of the patient were reviewed by me and considered in my medical decision making (see chart for details).  Clinical Course as of Apr 12 1728  Thu Apr 13, 2019  1503 Discussed results with patient, she already has Flagyl prescription at home will continue for a full 7-day course for her BV.  We will give her urology follow-up for her urinary symptoms.  Do not suspect this is UTI at this point.   [MT]    Clinical Course User Index [MT] Wyvonnia Dusky, MD   Patient presenting  with 3 weeks of progressively worsening lower midline abdominal pain and fullness, feels like she is unable to finish urinating and urinary frequency.  Differential includes UTI versus partial fibroid obstruction versus mild DKA versus other  She has mild suprapubic tenderness on exam, no firm of her distended bladder.  She is able to urinate in the emergency department.  We will check a UA for signs of infection.  She has however already been treating herself with Bactrim for 2 days.  She reports some mild fullness in the bilateral flanks.  However on ultrasound bedside exam I do not see any evidence of hydronephrosis.  Likewise she has no fever or other clinical signs to suggest pyelonephritis.  She has no focal tenderness to suggest acute appendicitis.  After 3 weeks of symptoms, I think it is less likely acute appendicitis.  Her GU exam is benign did not show evidence of PID or ovarian cyst.  She is requesting awaiting results of her test prior to treatment for possible STI. She is also requesting HIV testing which will provide.  Her urine pregnancy test is pending.  She is hemodynamically stable does not show any signs of ruptured ectopic pregnancy.    Final Clinical Impressions(s) / ED Diagnoses   Final diagnoses:  None    ED Discharge Orders    None       Alioune Hodgkin, Carola Rhine, MD 04/13/19 1730

## 2019-04-13 NOTE — Discharge Instructions (Addendum)
Continue taking Flagyl 500 mg twice daily to complete a full 7 day course.  Do NOT drink alcohol while taking this medicine, or for 5 days after finishing it.  It can make you very sick.

## 2019-04-14 LAB — HIV ANTIBODY (ROUTINE TESTING W REFLEX): HIV Screen 4th Generation wRfx: NONREACTIVE

## 2019-04-15 LAB — GC/CHLAMYDIA PROBE AMP (~~LOC~~) NOT AT ARMC
Chlamydia: NEGATIVE
Neisseria Gonorrhea: NEGATIVE

## 2019-05-16 ENCOUNTER — Emergency Department (HOSPITAL_BASED_OUTPATIENT_CLINIC_OR_DEPARTMENT_OTHER)
Admission: EM | Admit: 2019-05-16 | Discharge: 2019-05-16 | Disposition: A | Discharge status: Home / Self Care | Payer: 59 | Attending: Emergency Medicine | Admitting: Emergency Medicine

## 2019-05-16 ENCOUNTER — Encounter (HOSPITAL_BASED_OUTPATIENT_CLINIC_OR_DEPARTMENT_OTHER): Payer: Self-pay | Admitting: Emergency Medicine

## 2019-05-16 ENCOUNTER — Other Ambulatory Visit: Payer: Self-pay

## 2019-05-16 DIAGNOSIS — Z888 Allergy status to other drugs, medicaments and biological substances status: Secondary | ICD-10-CM | POA: Insufficient documentation

## 2019-05-16 DIAGNOSIS — I10 Essential (primary) hypertension: Secondary | ICD-10-CM | POA: Diagnosis not present

## 2019-05-16 DIAGNOSIS — Z20828 Contact with and (suspected) exposure to other viral communicable diseases: Secondary | ICD-10-CM | POA: Diagnosis not present

## 2019-05-16 DIAGNOSIS — Z885 Allergy status to narcotic agent status: Secondary | ICD-10-CM | POA: Insufficient documentation

## 2019-05-16 DIAGNOSIS — Z794 Long term (current) use of insulin: Secondary | ICD-10-CM | POA: Insufficient documentation

## 2019-05-16 DIAGNOSIS — Z79899 Other long term (current) drug therapy: Secondary | ICD-10-CM | POA: Insufficient documentation

## 2019-05-16 DIAGNOSIS — E119 Type 2 diabetes mellitus without complications: Secondary | ICD-10-CM | POA: Diagnosis not present

## 2019-05-16 DIAGNOSIS — Z20822 Contact with and (suspected) exposure to covid-19: Secondary | ICD-10-CM

## 2019-05-16 DIAGNOSIS — J069 Acute upper respiratory infection, unspecified: Secondary | ICD-10-CM | POA: Insufficient documentation

## 2019-05-16 DIAGNOSIS — E782 Mixed hyperlipidemia: Secondary | ICD-10-CM | POA: Insufficient documentation

## 2019-05-16 DIAGNOSIS — R05 Cough: Secondary | ICD-10-CM | POA: Diagnosis present

## 2019-05-16 NOTE — ED Triage Notes (Signed)
Cough, congestion and sore throat since Friday.

## 2019-05-16 NOTE — ED Notes (Signed)
Pt verbalized understanding of d/c instructions and understands she needs to quarantine until COVID results come back.

## 2019-05-16 NOTE — ED Provider Notes (Signed)
[facility] EMERGENCY DEPARTMENT Provider Note   CSN: [id number] Arrival date & time: 05/16/19  5176     History   Chief Complaint Chief Complaint  Patient presents with  . Cough  . Sore Throat    HPI Jeremiah Tarpley is a 43 y.o. female.     HPI Patient reports 4 days ago she started to get a sore throat.  She reports it was scratchy and uncomfortable.  She started doing some salt water gargles and has gotten improvement.  She reports now her throat is not really sore is just a little dry.  She reports she however has gone on to also developed some head congestion.  She reports she feels like she has nasal stuffiness and mild headache.  She reports she is experiencing some cough at nighttime.  No chest pain or shortness of breath.  No documented fever or myalgia.  Patient does have diabetes.  She denies any complications at this time.  She is not having abdominal pain, urinary frequency, nausea vomiting or diarrhea.  Patient works in a healthcare setting. Past Medical History:  Diagnosis Date  . Diabetes mellitus without complication ([place])   . High cholesterol     Patient Active Problem List   Diagnosis Date Noted  . Hypertension 06/02/2016  . Left hip pain 06/02/2016  . Heartburn 04/08/2016  . Mixed hyperlipidemia 11/14/2015  . Uncontrolled type 2 diabetes mellitus without complication, with long-term current use of insulin 11/14/2015  . Acquired hypothyroidism 11/14/2015    Past Surgical History:  Procedure Laterality Date  . ECTOPIC PREGNANCY SURGERY    . EYE SURGERY     Bilateral cataract surgery  . TUBAL LIGATION       OB History    Gravida  2   Para      Term      Preterm      AB  1   Living  1     SAB      TAB      Ectopic  1   Multiple      Live Births               Home Medications    Prior to Admission medications   Medication Sig Start Date End Date Taking? Authorizing Provider  amoxicillin (AMOXIL) 500 MG capsule  Take 1 capsule (500 mg total) by mouth 3 (three) times daily. 11/15/17   Virgel Manifold, MD  benzonatate (TESSALON) 100 MG capsule Take 1 capsule (100 mg total) by mouth every 8 (eight) hours as needed for cough. 11/15/17   Virgel Manifold, MD  clindamycin (CLEOCIN) 150 MG capsule Take 2 capsules (300 mg total) by mouth 4 (four) times daily. 05/26/17   Ward, Ozella Almond, PA-C  doxycycline (VIBRAMYCIN) 100 MG capsule Take 1 capsule (100 mg total) by mouth 2 (two) times daily. 01/30/19   Virgel Manifold, MD  glipiZIDE (GLUCOTROL XL) 10 MG 24 hr tablet Take by mouth.    [provider]  Insulin Aspart (NOVOLOG [place]) Inject into the skin.    [provider]  insulin lispro (HUMALOG) 100 UNIT/ML injection Inject into the skin 2 (two) times daily.    [provider]  metFORMIN (GLUCOPHAGE) 850 MG tablet Take by mouth.    [provider]  metroNIDAZOLE (FLAGYL) 500 MG tablet Take 1 tablet (500 mg total) by mouth 2 (two) times daily. One po bid x 7 days 09/18/18   Varney Biles, MD  oxymetazoline [facility] NASAL  SPRAY) 0.05 % nasal spray Place 1 spray into both nostrils 2 (two) times daily. 11/15/17   Raeford RazorKohut, Stephen, MD  pravastatin (PRAVACHOL) 20 MG tablet Take by mouth.    [provider]  selenium sulfide (SELSUN) 2.5 % shampoo Massage 5-10 mL into wet scalp. Leave on for 2-3 minutes, then rinse scalp and repeat.  Apply twice each week for the next 2-4 weeks. 05/26/17   Ward, Chase PicketJaime Pilcher, PA-C    Family History Family History  Problem Relation Age of Onset  . Leukemia Mother   . Diabetes Father   . Heart failure Father   . Aneurysm Sister     Social History Social History   Tobacco Use  . Smoking status: Former Games developermoker  . Smokeless tobacco: Never Used  Substance Use Topics  . Alcohol use: No  . Drug use: No     Allergies   Etodolac, Macrolides and ketolides, Meloxicam, and Oxycontin [oxycodone hcl]   Review of Systems Review of Systems 10 Systems  reviewed and are negative for acute change except as noted in the HPI.  Physical Exam Updated Vital Signs BP (!) 145/69 (BP Location: Right Arm)   Pulse 97   Temp 98.4 F (36.9 C) (Oral)   Resp 18   Ht 5\' 5"  (1.651 m)   Wt 79.4 kg   LMP 04/30/2019   SpO2 99%   BMI 29.12 kg/m   Physical Exam Constitutional:      Comments: Patient is well in appearance.  She is alert and nontoxic.  No distress.  HENT:     Head: Normocephalic and atraumatic.     Mouth/Throat:     Comments: Mucous membranes are pink and moist.  Posterior oropharynx widely patent.  No erythema or exudate on tonsillar pillars or tonsils. Eyes:     Extraocular Movements: Extraocular movements intact.     Conjunctiva/sclera: Conjunctivae normal.  Neck:     Musculoskeletal: Neck supple.  Cardiovascular:     Rate and Rhythm: Normal rate and regular rhythm.  Pulmonary:     Effort: Pulmonary effort is normal.     Breath sounds: Normal breath sounds.  Abdominal:     General: There is no distension.     Palpations: Abdomen is soft.     Tenderness: There is no abdominal tenderness. There is no guarding.  Musculoskeletal: Normal range of motion.  Lymphadenopathy:     Cervical: No cervical adenopathy.  Skin:    General: Skin is warm and dry.  Neurological:     General: No focal deficit present.     Mental Status: She is oriented to person, place, and time.     Motor: No weakness.     Coordination: Coordination normal.  Psychiatric:        Mood and Affect: Mood normal.      ED Treatments / Results  Labs (all labs ordered are listed, but only abnormal results are displayed) Labs Reviewed - No data to display  EKG None  Radiology No results found.  Procedures Procedures (including critical care time)  Medications Ordered in ED Medications - No data to display   Initial Impression / Assessment and Plan / ED Course  I have reviewed the triage vital signs and the nursing notes.  Pertinent labs &  imaging results that were available during my care of the patient were reviewed by me and considered in my medical decision making (see chart for details).        Patient is clinically well  in appearance.  She has had URI symptoms for 4 days.  Sore throat is improving.  Throat exam is normal.  Lungs are clear.  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  Given symptoms there is concern for possible coronavirus.  We will proceed with testing and quarantine awaiting results.  Recommendation for 2-week 14 if results positive.  She can review this with her employer for their policies.  Patient is clinically well at this time.  No signs of respiratory distress, hypoxia, dehydration or confusion.  Patient is stable for conservative home management of uncomplicated URI symptoms.  Dnasia Gauna was evaluated in Emergency Department on 05/16/2019 for the symptoms described in the history of present illness. She was evaluated in the context of the global COVID-19 pandemic, which necessitated consideration that the patient might be at risk for infection with the SARS-CoV-2 virus that causes COVID-19. Institutional protocols and algorithms that pertain to the evaluation of patients at risk for COVID-19 are in a state of rapid change based on information released by regulatory bodies including the CDC and federal and state organizations. These policies and algorithms were followed during the patient's care in the ED.  Final Clinical Impressions(s) / ED Diagnoses   Final diagnoses:  Upper respiratory tract infection, unspecified type  Encounter for laboratory testing for COVID-19 virus    ED Discharge Orders    None       Arby Barrette, MD 05/16/19 0900

## 2019-05-17 LAB — NOVEL CORONAVIRUS, NAA (HOSP ORDER, SEND-OUT TO REF LAB; TAT 18-24 HRS): SARS-CoV-2, NAA: NOT DETECTED

## 2019-05-18 ENCOUNTER — Telehealth: Payer: Self-pay | Admitting: Internal Medicine

## 2019-05-18 NOTE — Telephone Encounter (Signed)
Pt aware covid lab test negative, not detected  Would like negative results faxed to her employer   Rollene Fare  Fax: [phone]

## 2019-05-18 NOTE — Telephone Encounter (Signed)
Patient called back and would like results refaxed over ASAP to [phone], attention [facility].

## 2019-05-18 NOTE — Telephone Encounter (Signed)
Results will be faxed to [employer] ,per previous note.

## 2019-05-18 NOTE — Telephone Encounter (Signed)
Spoke with patient to verify  who the recipient for the fax will be.She is requesting they are faxed to [employer] at [phone] cb #  [phone]

## 2019-05-18 NOTE — Telephone Encounter (Signed)
Patient's employer still has not gotten the COVID results faxed to employer.  Patient would like it refaxed at [phone] as soon as possible.

## 2019-08-15 ENCOUNTER — Other Ambulatory Visit: Payer: Self-pay

## 2019-08-15 ENCOUNTER — Encounter (HOSPITAL_BASED_OUTPATIENT_CLINIC_OR_DEPARTMENT_OTHER): Payer: Self-pay | Admitting: Emergency Medicine

## 2019-08-15 ENCOUNTER — Emergency Department (HOSPITAL_BASED_OUTPATIENT_CLINIC_OR_DEPARTMENT_OTHER)
Admission: EM | Admit: 2019-08-15 | Discharge: 2019-08-15 | Disposition: A | Discharge status: Home / Self Care | Payer: 59 | Attending: Emergency Medicine | Admitting: Emergency Medicine

## 2019-08-15 DIAGNOSIS — Z5321 Procedure and treatment not carried out due to patient leaving prior to being seen by health care provider: Secondary | ICD-10-CM | POA: Insufficient documentation

## 2019-08-15 DIAGNOSIS — R519 Headache, unspecified: Secondary | ICD-10-CM | POA: Insufficient documentation

## 2019-08-15 DIAGNOSIS — R110 Nausea: Secondary | ICD-10-CM | POA: Insufficient documentation

## 2019-08-15 NOTE — ED Triage Notes (Signed)
Headache and nausea since yesterday.

## 2019-08-15 NOTE — ED Notes (Signed)
Offered zofran for nausea but declined.

## 2020-03-23 ENCOUNTER — Other Ambulatory Visit: Payer: Self-pay

## 2020-03-23 ENCOUNTER — Emergency Department (HOSPITAL_BASED_OUTPATIENT_CLINIC_OR_DEPARTMENT_OTHER)
Admission: EM | Admit: 2020-03-23 | Discharge: 2020-03-23 | Disposition: A | Discharge status: Home / Self Care | Payer: Medicaid Other | Attending: Emergency Medicine | Admitting: Emergency Medicine

## 2020-03-23 ENCOUNTER — Encounter (HOSPITAL_BASED_OUTPATIENT_CLINIC_OR_DEPARTMENT_OTHER): Payer: Self-pay | Admitting: Emergency Medicine

## 2020-03-23 DIAGNOSIS — R42 Dizziness and giddiness: Secondary | ICD-10-CM | POA: Insufficient documentation

## 2020-03-23 DIAGNOSIS — Z5321 Procedure and treatment not carried out due to patient leaving prior to being seen by health care provider: Secondary | ICD-10-CM | POA: Insufficient documentation

## 2020-03-23 DIAGNOSIS — R519 Headache, unspecified: Secondary | ICD-10-CM | POA: Insufficient documentation

## 2020-03-23 LAB — BASIC METABOLIC PANEL
Anion gap: 10 (ref 5–15)
BUN: 10 mg/dL (ref 6–20)
CO2: 22 mmol/L (ref 22–32)
Calcium: 9 mg/dL (ref 8.9–10.3)
Chloride: 99 mmol/L (ref 98–111)
Creatinine, Ser: 0.76 mg/dL (ref 0.44–1.00)
GFR calc Af Amer: >60 mL/min (ref >60)
GFR calc non Af Amer: >60 mL/min (ref >60)
Glucose, Bld: 321 mg/dL — ABNORMAL HIGH (ref 70–99)
Potassium: 3.9 mmol/L (ref 3.5–5.1)
Sodium: 131 mmol/L — ABNORMAL LOW (ref 135–145)

## 2020-03-23 LAB — CBC
HCT: 36.2 % (ref 36.0–46.0)
Hemoglobin: 11.6 g/dL — ABNORMAL LOW (ref 12.0–15.0)
MCH: 28 pg (ref 26.0–34.0)
MCHC: 32 g/dL (ref 30.0–36.0)
MCV: 87.4 fL (ref 80.0–100.0)
Platelets: 410 10*3/uL — ABNORMAL HIGH (ref 150–400)
RBC: 4.14 MIL/uL (ref 3.87–5.11)
RDW: 14.5 % (ref 11.5–15.5)
WBC: 4.6 10*3/uL (ref 4.0–10.5)
nRBC: 0 % (ref 0.0–0.2)

## 2020-03-23 NOTE — ED Triage Notes (Signed)
Intermittent dizziness and headache x 1 week. States she feel emotionally "off" with increase in stress. She reports that she is on her menstrual cycle.

## 2020-07-24 ENCOUNTER — Emergency Department (HOSPITAL_BASED_OUTPATIENT_CLINIC_OR_DEPARTMENT_OTHER): Payer: BLUE CROSS/BLUE SHIELD

## 2020-07-24 ENCOUNTER — Encounter (HOSPITAL_BASED_OUTPATIENT_CLINIC_OR_DEPARTMENT_OTHER): Payer: Self-pay

## 2020-07-24 ENCOUNTER — Emergency Department (HOSPITAL_BASED_OUTPATIENT_CLINIC_OR_DEPARTMENT_OTHER)
Admission: EM | Admit: 2020-07-24 | Discharge: 2020-07-24 | Disposition: A | Discharge status: Home / Self Care | Payer: BLUE CROSS/BLUE SHIELD | Attending: Emergency Medicine | Admitting: Emergency Medicine

## 2020-07-24 ENCOUNTER — Other Ambulatory Visit: Payer: Self-pay

## 2020-07-24 DIAGNOSIS — Z794 Long term (current) use of insulin: Secondary | ICD-10-CM | POA: Diagnosis not present

## 2020-07-24 DIAGNOSIS — I10 Essential (primary) hypertension: Secondary | ICD-10-CM | POA: Diagnosis not present

## 2020-07-24 DIAGNOSIS — M546 Pain in thoracic spine: Secondary | ICD-10-CM | POA: Insufficient documentation

## 2020-07-24 DIAGNOSIS — N898 Other specified noninflammatory disorders of vagina: Secondary | ICD-10-CM | POA: Diagnosis not present

## 2020-07-24 DIAGNOSIS — R102 Pelvic and perineal pain: Secondary | ICD-10-CM | POA: Diagnosis present

## 2020-07-24 DIAGNOSIS — R079 Chest pain, unspecified: Secondary | ICD-10-CM

## 2020-07-24 DIAGNOSIS — Z79899 Other long term (current) drug therapy: Secondary | ICD-10-CM | POA: Insufficient documentation

## 2020-07-24 DIAGNOSIS — Z9851 Tubal ligation status: Secondary | ICD-10-CM | POA: Diagnosis not present

## 2020-07-24 DIAGNOSIS — E119 Type 2 diabetes mellitus without complications: Secondary | ICD-10-CM | POA: Diagnosis not present

## 2020-07-24 DIAGNOSIS — Z87891 Personal history of nicotine dependence: Secondary | ICD-10-CM | POA: Insufficient documentation

## 2020-07-24 DIAGNOSIS — E039 Hypothyroidism, unspecified: Secondary | ICD-10-CM | POA: Insufficient documentation

## 2020-07-24 DIAGNOSIS — Z7984 Long term (current) use of oral hypoglycemic drugs: Secondary | ICD-10-CM | POA: Insufficient documentation

## 2020-07-24 LAB — WET PREP, GENITAL
Clue Cells Wet Prep HPF POC: NONE SEEN
Sperm: NONE SEEN
Trich, Wet Prep: NONE SEEN
Yeast Wet Prep HPF POC: NONE SEEN

## 2020-07-24 LAB — PREGNANCY, URINE: Preg Test, Ur: NEGATIVE

## 2020-07-24 LAB — HIV ANTIBODY (ROUTINE TESTING W REFLEX): HIV Screen 4th Generation wRfx: NONREACTIVE

## 2020-07-24 LAB — RAPID HIV SCREEN (HIV 1/2 AB+AG)
HIV 1/2 Antibodies: NONREACTIVE
HIV-1 P24 Antigen - HIV24: NONREACTIVE

## 2020-07-24 NOTE — ED Provider Notes (Signed)
[facility] EMERGENCY DEPARTMENT Provider Note   CSN: [id number] Arrival date & time: 07/24/20  0865     History Chief Complaint  Patient presents with  . Chest Pain  . Vaginal Discharge    Shawna Coleman is a 44 y.o. female.  Patient here for upper back pain, also here for vaginal irritation.  May be some discharge.  Concern for yeast infection.  No pain with urination.  The history is provided by the patient.  Chest Pain Chest pain location: left back/upper back. Pain quality: aching   Pain severity:  Mild Onset quality:  Gradual Timing:  Intermittent Progression:  Waxing and waning Chronicity:  New Context: raising an arm   Relieved by:  Nothing Worsened by:  Nothing Associated symptoms: no abdominal pain, no back pain, no cough, no fever, no palpitations, no shortness of breath and no vomiting   Risk factors: diabetes mellitus and high cholesterol        Past Medical History:  Diagnosis Date  . Diabetes mellitus without complication (HCC)   . High cholesterol     Patient Active Problem List   Diagnosis Date Noted  . Hypertension 06/02/2016  . Left hip pain 06/02/2016  . Heartburn 04/08/2016  . Mixed hyperlipidemia 11/14/2015  . Uncontrolled type 2 diabetes mellitus without complication, with long-term current use of insulin 11/14/2015  . Acquired hypothyroidism 11/14/2015    Past Surgical History:  Procedure Laterality Date  . ECTOPIC PREGNANCY SURGERY    . EYE SURGERY     Bilateral cataract surgery  . TUBAL LIGATION       OB History    Gravida  2   Para      Term      Preterm      AB  1   Living  1     SAB      IAB      Ectopic  1   Multiple      Live Births              Family History  Problem Relation Age of Onset  . Leukemia Mother   . Diabetes Father   . Heart failure Father   . Aneurysm Sister     Social History   Tobacco Use  . Smoking status: Former Games developer  . Smokeless tobacco: Never Used   Substance Use Topics  . Alcohol use: Yes    Comment: social  . Drug use: No    Home Medications Prior to Admission medications   Medication Sig Start Date End Date Taking? Authorizing Provider  amoxicillin (AMOXIL) 500 MG capsule Take 1 capsule (500 mg total) by mouth 3 (three) times daily. 11/15/17   Raeford Razor, MD  benzonatate (TESSALON) 100 MG capsule Take 1 capsule (100 mg total) by mouth every 8 (eight) hours as needed for cough. 11/15/17   Raeford Razor, MD  clindamycin (CLEOCIN) 150 MG capsule Take 2 capsules (300 mg total) by mouth 4 (four) times daily. 05/26/17   Ward, Chase Picket, PA-C  doxycycline (VIBRAMYCIN) 100 MG capsule Take 1 capsule (100 mg total) by mouth 2 (two) times daily. 01/30/19   Raeford Razor, MD  fluconazole (DIFLUCAN) 150 MG tablet  07/13/20   [provider]  glipiZIDE (GLUCOTROL XL) 10 MG 24 hr tablet Take by mouth.    [provider]  Insulin Aspart (NOVOLOG [place]) Inject into the skin.    [provider]  insulin lispro (HUMALOG) 100 UNIT/ML injection Inject into the  skin 2 (two) times daily.    [provider]  metFORMIN (GLUCOPHAGE) 850 MG tablet Take by mouth.    [provider]  metroNIDAZOLE (FLAGYL) 500 MG tablet Take 1 tablet (500 mg total) by mouth 2 (two) times daily. One po bid x 7 days 09/18/18   Derwood Kaplan, MD  oxymetazoline (AFRIN NASAL SPRAY) 0.05 % nasal spray Place 1 spray into both nostrils 2 (two) times daily. 11/15/17   Raeford Razor, MD  pravastatin (PRAVACHOL) 20 MG tablet Take by mouth.    [provider]  selenium sulfide (SELSUN) 2.5 % shampoo Massage 5-10 mL into wet scalp. Leave on for 2-3 minutes, then rinse scalp and repeat.  Apply twice each week for the next 2-4 weeks. 05/26/17   Ward, Chase Picket, PA-C    Allergies    Etodolac, Macrolides and ketolides, Meloxicam, and Oxycontin [oxycodone hcl]  Review of Systems   Review of Systems  Constitutional: Negative for  chills and fever.  HENT: Negative for ear pain and sore throat.   Eyes: Negative for pain and visual disturbance.  Respiratory: Negative for cough and shortness of breath.   Cardiovascular: Positive for chest pain. Negative for palpitations.  Gastrointestinal: Negative for abdominal pain and vomiting.  Genitourinary: Positive for vaginal pain. Negative for decreased urine volume, difficulty urinating, dyspareunia, dysuria, enuresis, flank pain, frequency, genital sores, hematuria, menstrual problem, pelvic pain, urgency, vaginal bleeding and vaginal discharge.  Musculoskeletal: Negative for arthralgias and back pain.  Skin: Negative for color change and rash.  Neurological: Negative for seizures and syncope.  All other systems reviewed and are negative.   Physical Exam Updated Vital Signs  ED Triage Vitals  Enc Vitals Group     BP 07/24/20 0749 (!) 152/87     Pulse Rate 07/24/20 0749 91     Resp 07/24/20 0749 18     Temp 07/24/20 0749 99 F (37.2 C)     Temp Source 07/24/20 0749 Oral     SpO2 07/24/20 0749 99 %     Weight 07/24/20 0748 174 lb (78.9 kg)     Height 07/24/20 0748 5\' 5"  (1.651 m)     Head Circumference --      Peak Flow --      Pain Score 07/24/20 0746 7     Pain Loc --      Pain Edu? --      Excl. in GC? --     Physical Exam Vitals and nursing note reviewed. Exam conducted with a chaperone present.  Constitutional:      General: Shawna Coleman is not in acute distress.    Appearance: Shawna Coleman is well-developed and well-nourished. Shawna Coleman is not ill-appearing.  HENT:     Head: Normocephalic and atraumatic.  Eyes:     Conjunctiva/sclera: Conjunctivae normal.  Cardiovascular:     Rate and Rhythm: Normal rate and regular rhythm.     Pulses:          Radial pulses are 2+ on the right side and 2+ on the left side.     Heart sounds: Normal heart sounds. No murmur heard.   Pulmonary:     Effort: Pulmonary effort is normal. No respiratory distress.     Breath sounds: Normal  breath sounds.  Chest:     Chest wall: Tenderness (left side of anterior and posterior chest wall) present.  Abdominal:     Palpations: Abdomen is soft.     Tenderness: There is no abdominal tenderness.  Genitourinary:  Labia:        Right: No rash or tenderness.        Left: No rash or tenderness.      Vagina: Vaginal discharge present.     Cervix: No friability, lesion, erythema or cervical bleeding.     Uterus: Normal.   Musculoskeletal:        General: No edema.     Cervical back: Neck supple.     Comments: Left subscap tenderness   Skin:    General: Skin is warm and dry.     Capillary Refill: Capillary refill takes less than 2 seconds.  Neurological:     General: No focal deficit present.     Mental Status: Shawna Coleman is alert.  Psychiatric:        Mood and Affect: Mood and affect normal.     ED Results / Procedures / Treatments   Labs (all labs ordered are listed, but only abnormal results are displayed) Labs Reviewed  WET PREP, GENITAL - Abnormal; Notable for the following components:      Result Value   WBC, Wet Prep HPF POC MANY (*)    All other components within normal limits  PREGNANCY, URINE  HIV ANTIBODY (ROUTINE TESTING W REFLEX)  RAPID HIV SCREEN (HIV 1/2 AB+AG)  RPR  GC/CHLAMYDIA PROBE AMP ([place]) NOT AT [facility]    EKG EKG Interpretation  Date/Time:  Wednesday July 24 2020 07:49:06 EST Ventricular Rate:  84 PR Interval:    QRS Duration: 78 QT Interval:  338 QTC Calculation: 400 R Axis:   73 Text Interpretation: Sinus rhythm Baseline wander in lead(s) II III aVF V3 V4 V5 V6 Confirmed by Virgina NorfolkAdam, Kambre Messner [phone]([id number]) on 07/24/2020 8:45:11 AM   Radiology DG Chest 2 View  Result Date: 07/24/2020 CLINICAL DATA:  Chest pain, chest tightness over left breast. EXAM: CHEST - 2 VIEW COMPARISON:  Chest x-ray 11/15/2017 FINDINGS: The heart size and mediastinal contours are within normal limits. No focal consolidation. No pulmonary edema. No pleural  effusion. No pneumothorax. No acute osseous abnormality. IMPRESSION: No active cardiopulmonary disease. Electronically Signed   By: Tish FredericksonMorgane  Naveau M.D.   On: 07/24/2020 08:22    Procedures Procedures (including critical care time)  Medications Ordered in ED Medications - No data to display  ED Course  I have reviewed the triage vital signs and the nursing notes.  Pertinent labs & imaging results that were available during my care of the patient were reviewed by me and considered in my medical decision making (see chart for details).    MDM Rules/Calculators/A&P                          Harvel Qualemanda Goulette is here for chest pain/back pain, vaginal discharge.  Normal vitals.  No fever.  Upper back pain for the last several days.  Seems muscular in nature.  No cardiac risk factors.  PERC negative.  Doubt PE.  EKG shows sinus rhythm.  No ischemic changes.  Chest x-ray showed no pneumothorax, no pneumonia.  Overall suspect musculoskeletal pain.  Recommend Tylenol and Motrin and rest.  Vaginal discharge for several days.  Wet prep negative for bacterial vaginosis, trichomonas, yeast.  Reports just taking Diflucan for yeast infection.  Suspect may be a resolving yeast infection.  Or physiologic discharge.  Pelvic exam is overall unremarkable.  No concern for PID.  Gonorrhea and Chlamydia testing pending but will hold on empiric treatment as patient has low suspicion  for STD.  Pregnancy test was negative.  Discharged in ED in good condition.  Recommend follow-up with OB/GYN.  This chart was dictated using voice recognition software.  Despite best efforts to proofread,  errors can occur which can change the documentation meaning.     Final Clinical Impression(s) / ED Diagnoses Final diagnoses:  Vaginal discharge  Chest pain, unspecified type    Rx / DC Orders ED Discharge Orders    None       Virgina Norfolk, DO 07/24/20 [id number]

## 2020-07-24 NOTE — ED Notes (Signed)
Pt reports taking Diflucan at home

## 2020-07-24 NOTE — ED Notes (Signed)
Patient transported to X-ray 

## 2020-07-24 NOTE — ED Triage Notes (Signed)
Pt reports some tightness under left breast that radiates to Left shoulder and Left mid back. Pt also reports having vaginal irritation

## 2020-07-25 LAB — GC/CHLAMYDIA PROBE AMP (~~LOC~~) NOT AT ARMC
Chlamydia: NEGATIVE
Comment: NEGATIVE
Comment: NORMAL
Neisseria Gonorrhea: NEGATIVE

## 2020-07-25 LAB — RPR: RPR Ser Ql: NONREACTIVE

## 2020-08-08 ENCOUNTER — Encounter (HOSPITAL_BASED_OUTPATIENT_CLINIC_OR_DEPARTMENT_OTHER): Payer: Self-pay | Admitting: Emergency Medicine

## 2020-08-08 ENCOUNTER — Other Ambulatory Visit: Payer: Self-pay

## 2020-08-08 DIAGNOSIS — E119 Type 2 diabetes mellitus without complications: Secondary | ICD-10-CM | POA: Diagnosis not present

## 2020-08-08 DIAGNOSIS — R109 Unspecified abdominal pain: Secondary | ICD-10-CM | POA: Insufficient documentation

## 2020-08-08 DIAGNOSIS — R0602 Shortness of breath: Secondary | ICD-10-CM | POA: Insufficient documentation

## 2020-08-08 DIAGNOSIS — M549 Dorsalgia, unspecified: Secondary | ICD-10-CM | POA: Diagnosis present

## 2020-08-08 DIAGNOSIS — Z5321 Procedure and treatment not carried out due to patient leaving prior to being seen by health care provider: Secondary | ICD-10-CM | POA: Insufficient documentation

## 2020-08-08 LAB — CBC
HCT: 34.6 % — ABNORMAL LOW (ref 36.0–46.0)
Hemoglobin: 11.3 g/dL — ABNORMAL LOW (ref 12.0–15.0)
MCH: 28.2 pg (ref 26.0–34.0)
MCHC: 32.7 g/dL (ref 30.0–36.0)
MCV: 86.3 fL (ref 80.0–100.0)
Platelets: 450 10*3/uL — ABNORMAL HIGH (ref 150–400)
RBC: 4.01 MIL/uL (ref 3.87–5.11)
RDW: 14.5 % (ref 11.5–15.5)
WBC: 7.2 10*3/uL (ref 4.0–10.5)
nRBC: 0 % (ref 0.0–0.2)

## 2020-08-08 LAB — COMPREHENSIVE METABOLIC PANEL
ALT: 12 U/L (ref 0–44)
AST: 17 U/L (ref 15–41)
Albumin: 4.2 g/dL (ref 3.5–5.0)
Alkaline Phosphatase: 83 U/L (ref 38–126)
Anion gap: 13 (ref 5–15)
BUN: 8 mg/dL (ref 6–20)
CO2: 23 mmol/L (ref 22–32)
Calcium: 9.7 mg/dL (ref 8.9–10.3)
Chloride: 100 mmol/L (ref 98–111)
Creatinine, Ser: 0.61 mg/dL (ref 0.44–1.00)
GFR, Estimated: >60 mL/min (ref >60)
Glucose, Bld: 138 mg/dL — ABNORMAL HIGH (ref 70–99)
Potassium: 2.8 mmol/L — ABNORMAL LOW (ref 3.5–5.1)
Sodium: 136 mmol/L (ref 135–145)
Total Bilirubin: 0.5 mg/dL (ref 0.3–1.2)
Total Protein: 8.3 g/dL — ABNORMAL HIGH (ref 6.5–8.1)

## 2020-08-08 LAB — URINALYSIS, ROUTINE W REFLEX MICROSCOPIC
Bilirubin Urine: NEGATIVE
Glucose, UA: NEGATIVE mg/dL
Hgb urine dipstick: NEGATIVE
Ketones, ur: 15 mg/dL — AB
Leukocytes,Ua: NEGATIVE
Nitrite: NEGATIVE
Protein, ur: NEGATIVE mg/dL
Specific Gravity, Urine: 1.015 (ref 1.005–1.030)
pH: 7.5 (ref 5.0–8.0)

## 2020-08-08 LAB — CBG MONITORING, ED: Glucose-Capillary: 113 mg/dL — ABNORMAL HIGH (ref 70–99)

## 2020-08-08 LAB — LIPASE, BLOOD: Lipase: 31 U/L (ref 11–51)

## 2020-08-08 LAB — PREGNANCY, URINE: Preg Test, Ur: NEGATIVE

## 2020-08-08 NOTE — ED Triage Notes (Signed)
Reports back pain, abd pain, shortness of breath, and feeling weak today. Hx diabetes

## 2020-08-09 ENCOUNTER — Emergency Department (HOSPITAL_BASED_OUTPATIENT_CLINIC_OR_DEPARTMENT_OTHER)
Admission: EM | Admit: 2020-08-09 | Discharge: 2020-08-09 | Disposition: A | Discharge status: Home / Self Care | Payer: BLUE CROSS/BLUE SHIELD | Attending: Emergency Medicine | Admitting: Emergency Medicine

## 2020-08-09 NOTE — ED Notes (Signed)
Called x3 with no response, pt no longer visualized in lobby. LWBS

## 2021-02-18 ENCOUNTER — Encounter (HOSPITAL_BASED_OUTPATIENT_CLINIC_OR_DEPARTMENT_OTHER): Payer: Self-pay | Admitting: *Deleted

## 2021-02-18 ENCOUNTER — Other Ambulatory Visit: Payer: Self-pay

## 2021-02-18 ENCOUNTER — Emergency Department (HOSPITAL_BASED_OUTPATIENT_CLINIC_OR_DEPARTMENT_OTHER)
Admission: EM | Admit: 2021-02-18 | Discharge: 2021-02-18 | Disposition: A | Discharge status: Home / Self Care | Payer: BLUE CROSS/BLUE SHIELD | Attending: Emergency Medicine | Admitting: Emergency Medicine

## 2021-02-18 DIAGNOSIS — E119 Type 2 diabetes mellitus without complications: Secondary | ICD-10-CM | POA: Diagnosis not present

## 2021-02-18 DIAGNOSIS — R0989 Other specified symptoms and signs involving the circulatory and respiratory systems: Secondary | ICD-10-CM

## 2021-02-18 DIAGNOSIS — I10 Essential (primary) hypertension: Secondary | ICD-10-CM | POA: Diagnosis not present

## 2021-02-18 DIAGNOSIS — Z794 Long term (current) use of insulin: Secondary | ICD-10-CM | POA: Diagnosis not present

## 2021-02-18 DIAGNOSIS — F17210 Nicotine dependence, cigarettes, uncomplicated: Secondary | ICD-10-CM | POA: Insufficient documentation

## 2021-02-18 DIAGNOSIS — F458 Other somatoform disorders: Secondary | ICD-10-CM | POA: Insufficient documentation

## 2021-02-18 DIAGNOSIS — Z7984 Long term (current) use of oral hypoglycemic drugs: Secondary | ICD-10-CM | POA: Diagnosis not present

## 2021-02-18 DIAGNOSIS — R198 Other specified symptoms and signs involving the digestive system and abdomen: Secondary | ICD-10-CM

## 2021-02-18 DIAGNOSIS — E039 Hypothyroidism, unspecified: Secondary | ICD-10-CM | POA: Insufficient documentation

## 2021-02-18 DIAGNOSIS — Z79899 Other long term (current) drug therapy: Secondary | ICD-10-CM | POA: Insufficient documentation

## 2021-02-18 MED ORDER — CYCLOBENZAPRINE HCL 10 MG PO TABS: 10.0000 mg | ORAL_TABLET | Freq: Two times a day (BID) | ORAL | 0 refills | Status: DC | PRN

## 2021-02-18 MED ORDER — FAMOTIDINE 20 MG PO TABS: 20.0000 mg | ORAL_TABLET | Freq: Two times a day (BID) | ORAL | 2 refills | Status: AC

## 2021-02-18 MED ORDER — ALUM & MAG HYDROXIDE-SIMETH 200-200-20 MG/5ML PO SUSP
30.0000 mL | Freq: Once | ORAL | Status: AC
  Administered 2021-02-18: 30 mL via ORAL
  Filled 2021-02-18: qty 30

## 2021-02-18 MED ORDER — LIDOCAINE VISCOUS HCL 2 % MT SOLN
15.0000 mL | Freq: Once | OROMUCOSAL | Status: AC
  Administered 2021-02-18: 15 mL via ORAL
  Filled 2021-02-18: qty 15

## 2021-02-18 NOTE — ED Triage Notes (Signed)
She feels like she has "something" in her throat. She is able to drink water with no difficulty. Hx of same feeling a few years ago and she had her esophagus stretched.

## 2021-02-18 NOTE — ED Notes (Signed)
Pt states GI cocktail has not improved her globus sensation. Provider notified

## 2021-02-18 NOTE — Discharge Instructions (Addendum)
Please call to make a follow-up appointment with your current GI doctor at [facility], or else you can try to make an appointment with one of our GI doctors.  I think some of what you are experiencing may be related to a reflux flareup.  This may be triggered by your iron pills.  I would suggest stopping his iron pills at this time and talking to your primary care doctor or OB/GYN doctor about alternative options to control your menstrual bleeding.  I would also suggest you start taking the Pepcid I have prescribed you.  Sometimes we are able to treat this issue with muscle relaxers, so I prescribed Flexeril to try at home.  If these are not working for you do not need to continue them.  Oftentimes a globus sensation will improve on its own over a few days.  If you find that you are choking, cannot swallow, or cannot breathe, you need to come back to the ER immediately.

## 2021-02-18 NOTE — ED Provider Notes (Signed)
[facility] EMERGENCY DEPARTMENT Provider Note   CSN: [id number] Arrival date & time: 02/18/21  1735     History CC: "Something in my throat."   Shawna Coleman is a 45 y.o. female with a history of reflux, anemia on iron, presenting to emergency department with globus sensation.  The patient reports that she began taking iron tablets about a week ago due to heavy menses and anemia issues.  She reports the iron is not sitting well with her stomach.  She says she has had significant hiccups and stomach upset taking this medication, as well as constipation issues, despite taking it with food.  She reports that 2 or 3 days ago she noticed a feeling like something was "stuck in my throat".  It is not painful with swallowing, but she has a feeling something is lodged here.  She denies drooling or coughing or choking or gagging.  She does take Nexium for her stomach daily but no other active medications.  She does report a history of requiring an esophageal "dilation" years ago with GI but does not actively follow with them.  She does not recall choking or gagging on any specific food or items.  HPI     Past Medical History:  Diagnosis Date   Diabetes mellitus without complication (HCC)    High cholesterol     Patient Active Problem List   Diagnosis Date Noted   Hypertension 06/02/2016   Left hip pain 06/02/2016   Heartburn 04/08/2016   Mixed hyperlipidemia 11/14/2015   Uncontrolled type 2 diabetes mellitus without complication, with long-term current use of insulin 11/14/2015   Acquired hypothyroidism 11/14/2015    Past Surgical History:  Procedure Laterality Date   ECTOPIC PREGNANCY SURGERY     EYE SURGERY     Bilateral cataract surgery   TUBAL LIGATION       OB History     Gravida  2   Para      Term      Preterm      AB  1   Living  1      SAB      IAB      Ectopic  1   Multiple      Live Births              Family History  Problem  Relation Age of Onset   Leukemia Mother    Diabetes Father    Heart failure Father    Aneurysm Sister     Social History   Tobacco Use   Smoking status: Some Days    Pack years: 0.00    Types: Cigarettes   Smokeless tobacco: Never  Vaping Use   Vaping Use: Never used  Substance Use Topics   Alcohol use: Not Currently    Comment: social   Drug use: No    Home Medications Prior to Admission medications   Medication Sig Start Date End Date Taking? Authorizing Provider  cyclobenzaprine (FLEXERIL) 10 MG tablet Take 1 tablet (10 mg total) by mouth 2 (two) times daily as needed for up to 20 doses for muscle spasms. 02/18/21  Yes Terald Sleeper, MD  famotidine (PEPCID) 20 MG tablet Take 1 tablet (20 mg total) by mouth 2 (two) times daily. Take 30 minutes before breakfast and dinner 02/18/21 03/20/21 Yes Machelle Raybon, Kermit Balo, MD  glipiZIDE (GLUCOTROL XL) 10 MG 24 hr tablet Take by mouth.   Yes [provider]  Insulin Aspart (NOVOLOG [place])  Inject into the skin.   Yes [provider]  metFORMIN (GLUCOPHAGE) 850 MG tablet Take by mouth.   Yes [provider]  pravastatin (PRAVACHOL) 20 MG tablet Take by mouth.   Yes [provider]  amoxicillin (AMOXIL) 500 MG capsule Take 1 capsule (500 mg total) by mouth 3 (three) times daily. 11/15/17   Raeford Razor, MD  benzonatate (TESSALON) 100 MG capsule Take 1 capsule (100 mg total) by mouth every 8 (eight) hours as needed for cough. 11/15/17   Raeford Razor, MD  clindamycin (CLEOCIN) 150 MG capsule Take 2 capsules (300 mg total) by mouth 4 (four) times daily. 05/26/17   Ward, Chase Picket, PA-C  doxycycline (VIBRAMYCIN) 100 MG capsule Take 1 capsule (100 mg total) by mouth 2 (two) times daily. 01/30/19   Raeford Razor, MD  fluconazole (DIFLUCAN) 150 MG tablet  07/13/20   [provider]  insulin lispro (HUMALOG) 100 UNIT/ML injection Inject into the skin 2 (two) times daily.    [provider]   metroNIDAZOLE (FLAGYL) 500 MG tablet Take 1 tablet (500 mg total) by mouth 2 (two) times daily. One po bid x 7 days 09/18/18   Derwood Kaplan, MD  oxymetazoline (AFRIN NASAL SPRAY) 0.05 % nasal spray Place 1 spray into both nostrils 2 (two) times daily. 11/15/17   Raeford Razor, MD  selenium sulfide (SELSUN) 2.5 % shampoo Massage 5-10 mL into wet scalp. Leave on for 2-3 minutes, then rinse scalp and repeat.  Apply twice each week for the next 2-4 weeks. 05/26/17   Ward, Chase Picket, PA-C    Allergies    Etodolac, Macrolides and ketolides, Meloxicam, and Oxycontin [oxycodone hcl]  Review of Systems   Review of Systems  Constitutional:  Negative for chills and fever.  HENT:  Positive for trouble swallowing. Negative for sore throat and voice change.   Respiratory:  Negative for cough and shortness of breath.   Cardiovascular:  Negative for chest pain and palpitations.  Gastrointestinal:  Positive for constipation and nausea. Negative for vomiting.  Musculoskeletal:  Negative for arthralgias and back pain.  Skin:  Negative for color change and rash.  Neurological:  Negative for syncope and light-headedness.  All other systems reviewed and are negative.  Physical Exam Updated Vital Signs BP (!) 161/86   Pulse 70   Temp 98.3 F (36.8 C) (Oral)   Resp 18   Ht 5\' 5"  (1.651 m)   Wt 78.9 kg   LMP 01/29/2021   SpO2 100%   BMI 28.95 kg/m   Physical Exam Constitutional:      General: She is not in acute distress. HENT:     Head: Normocephalic and atraumatic.     Mouth/Throat:     Mouth: Mucous membranes are dry.     Pharynx: No oropharyngeal exudate or posterior oropharyngeal erythema.  Eyes:     Conjunctiva/sclera: Conjunctivae normal.     Pupils: Pupils are equal, round, and reactive to light.  Cardiovascular:     Rate and Rhythm: Normal rate and regular rhythm.  Pulmonary:     Effort: Pulmonary effort is normal. No respiratory distress.     Comments: Oropharynx  non-erythematous.  No tonsillar swelling or exudate.  No uvular deviation.  No drooling. No brawny edema. No stridor. Voice is not muffled. Abdominal:     General: There is no distension.     Tenderness: There is no abdominal tenderness.  Skin:    General: Skin is warm and dry.  Neurological:  General: No focal deficit present.     Mental Status: She is alert and oriented to person, place, and time. Mental status is at baseline.  Psychiatric:        Mood and Affect: Mood normal.        Behavior: Behavior normal.    ED Results / Procedures / Treatments   Labs (all labs ordered are listed, but only abnormal results are displayed) Labs Reviewed - No data to display  EKG None  Radiology No results found.  Procedures Procedures   Medications Ordered in ED Medications  alum & mag hydroxide-simeth (MAALOX/MYLANTA) 200-200-20 MG/5ML suspension 30 mL (30 mLs Oral Given 02/18/21 1907)    And  lidocaine (XYLOCAINE) 2 % viscous mouth solution 15 mL (15 mLs Oral Given 02/18/21 1907)    ED Course  I have reviewed the triage vital signs and the nursing notes.  Pertinent labs & imaging results that were available during my care of the patient were reviewed by me and considered in my medical decision making (see chart for details).  Patient presents with a globus sensation in the setting of a week of GI and stomach upset after starting iron tablets.  I suspect the most likely related to acid reflux triggered by her iron.  I do not see or hear any evidence of an obstruction of the airway, she has not drooling and she is tolerating her secretions easily.  Her thyroid does not feel enlarged.  She does not have significant cervical lymphadenopathy.  We can try a GI cocktail with lidocaine, although explained her I am not sure that this will work.  I will also start her on Pepcid in addition to her Nexium for home.  I advised that she talk with her doctor about these iron tablets, if her  hemoglobin is relatively stable she likely does not to be on them if they are causing this level of upset.  She should talk about alternative options for controlling her menses, including birth control, with her PCP.  She should also follow-up with her GI doctor if his symptoms are persistent.    Final Clinical Impression(s) / ED Diagnoses Final diagnoses:  Globus sensation    Rx / DC Orders ED Discharge Orders          Ordered    cyclobenzaprine (FLEXERIL) 10 MG tablet  2 times daily PRN        02/18/21 1933    famotidine (PEPCID) 20 MG tablet  2 times daily        02/18/21 1933             Terald Sleeper, MD 02/19/21 [phone]

## 2021-08-12 ENCOUNTER — Other Ambulatory Visit: Payer: Self-pay

## 2021-08-12 ENCOUNTER — Encounter (HOSPITAL_BASED_OUTPATIENT_CLINIC_OR_DEPARTMENT_OTHER): Payer: Self-pay | Admitting: Emergency Medicine

## 2021-08-12 ENCOUNTER — Emergency Department (HOSPITAL_BASED_OUTPATIENT_CLINIC_OR_DEPARTMENT_OTHER)
Admission: EM | Admit: 2021-08-12 | Discharge: 2021-08-12 | Disposition: A | Discharge status: Home / Self Care | Payer: BLUE CROSS/BLUE SHIELD | Attending: Emergency Medicine | Admitting: Emergency Medicine

## 2021-08-12 DIAGNOSIS — J069 Acute upper respiratory infection, unspecified: Secondary | ICD-10-CM

## 2021-08-12 DIAGNOSIS — E039 Hypothyroidism, unspecified: Secondary | ICD-10-CM | POA: Diagnosis not present

## 2021-08-12 DIAGNOSIS — R52 Pain, unspecified: Secondary | ICD-10-CM

## 2021-08-12 DIAGNOSIS — Z7984 Long term (current) use of oral hypoglycemic drugs: Secondary | ICD-10-CM | POA: Insufficient documentation

## 2021-08-12 DIAGNOSIS — U071 COVID-19: Secondary | ICD-10-CM | POA: Diagnosis not present

## 2021-08-12 DIAGNOSIS — E119 Type 2 diabetes mellitus without complications: Secondary | ICD-10-CM | POA: Diagnosis not present

## 2021-08-12 DIAGNOSIS — Z794 Long term (current) use of insulin: Secondary | ICD-10-CM | POA: Insufficient documentation

## 2021-08-12 DIAGNOSIS — R059 Cough, unspecified: Secondary | ICD-10-CM | POA: Diagnosis present

## 2021-08-12 LAB — RESP PANEL BY RT-PCR (FLU A&B, COVID) ARPGX2
Influenza A by PCR: NEGATIVE
Influenza B by PCR: NEGATIVE
SARS Coronavirus 2 by RT PCR: POSITIVE — AB

## 2021-08-12 MED ORDER — NIRMATRELVIR/RITONAVIR (PAXLOVID)TABLET: 3.0000 | ORAL_TABLET | Freq: Two times a day (BID) | ORAL | 0 refills | Status: AC

## 2021-08-12 MED ORDER — FLUTICASONE PROPIONATE 50 MCG/ACT NA SUSP: 2.0000 | Freq: Every day | NASAL | 0 refills | Status: AC

## 2021-08-12 MED ORDER — CYCLOBENZAPRINE HCL 10 MG PO TABS: 10.0000 mg | ORAL_TABLET | Freq: Two times a day (BID) | ORAL | 0 refills | Status: DC | PRN

## 2021-08-12 MED ORDER — BENZONATATE 100 MG PO CAPS: 100.0000 mg | ORAL_CAPSULE | Freq: Three times a day (TID) | ORAL | 0 refills | Status: DC

## 2021-08-12 NOTE — ED Triage Notes (Signed)
Pt arrives pov with c/o cough, body aches congestion and HA x 2 days. Denies fever

## 2021-08-12 NOTE — ED Provider Notes (Signed)
[facility] EMERGENCY DEPARTMENT Provider Note   CSN: [id number] Arrival date & time: 08/12/21  4967     History  Chief Complaint  Patient presents with   Cough    Shawna Coleman is a 46 y.o. female.  HPI     46yo female with history of DM, hlpd, (htn per chart, not per pt not on anything, hx of ectopic pregnancy surgery, acquired hypothyroidism , presents with cough, congestion, body aches.   Congestion started Monday, has progressed, cough getting getting worse, difficult to sleep at night because of cough. Body aches. No nausea, vomiting or diarrhea No known sick contacts No known fevers Chills   No flu vaccine Had first 2 covid shots but not booster   Home Medications Prior to Admission medications   Medication Sig Start Date End Date Taking? Authorizing Provider  benzonatate (TESSALON) 100 MG capsule Take 1 capsule (100 mg total) by mouth every 8 (eight) hours. 08/12/21  Yes Alvira Monday, MD  cyclobenzaprine (FLEXERIL) 10 MG tablet Take 1 tablet (10 mg total) by mouth 2 (two) times daily as needed for muscle spasms. 08/12/21  Yes Alvira Monday, MD  fluticasone (FLONASE) 50 MCG/ACT nasal spray Place 2 sprays into both nostrils daily for 5 days. 08/12/21 08/17/21 Yes Alvira Monday, MD  nirmatrelvir/ritonavir EUA (PAXLOVID) 20 x 150 MG & 10 x 100MG  TABS Take 3 tablets by mouth 2 (two) times daily for 5 days. Patient GFR is 60.Take nirmatrelvir (150 mg) two tablets twice daily for 5 days and ritonavir (100 mg) one tablet twice daily for 5 days. 08/12/21 08/17/21 Yes Alvira Monday, MD  amoxicillin (AMOXIL) 500 MG capsule Take 1 capsule (500 mg total) by mouth 3 (three) times daily. 11/15/17   Raeford Razor, MD  clindamycin (CLEOCIN) 150 MG capsule Take 2 capsules (300 mg total) by mouth 4 (four) times daily. 05/26/17   Ward, Chase Picket, PA-C  doxycycline (VIBRAMYCIN) 100 MG capsule Take 1 capsule (100 mg total) by mouth 2 (two) times daily. 01/30/19   Raeford Razor, MD  famotidine (PEPCID) 20 MG tablet Take 1 tablet (20 mg total) by mouth 2 (two) times daily. Take 30 minutes before breakfast and dinner 02/18/21 03/20/21  Terald Sleeper, MD  fluconazole (DIFLUCAN) 150 MG tablet  07/13/20   [provider]  glipiZIDE (GLUCOTROL XL) 10 MG 24 hr tablet Take by mouth.    [provider]  Insulin Aspart (NOVOLOG [place]) Inject into the skin.    [provider]  insulin lispro (HUMALOG) 100 UNIT/ML injection Inject into the skin 2 (two) times daily.    [provider]  metFORMIN (GLUCOPHAGE) 850 MG tablet Take by mouth.    [provider]  metroNIDAZOLE (FLAGYL) 500 MG tablet Take 1 tablet (500 mg total) by mouth 2 (two) times daily. One po bid x 7 days 09/18/18   Derwood Kaplan, MD  oxymetazoline (AFRIN NASAL SPRAY) 0.05 % nasal spray Place 1 spray into both nostrils 2 (two) times daily. 11/15/17   Raeford Razor, MD  pravastatin (PRAVACHOL) 20 MG tablet Take by mouth.    [provider]  selenium sulfide (SELSUN) 2.5 % shampoo Massage 5-10 mL into wet scalp. Leave on for 2-3 minutes, then rinse scalp and repeat.  Apply twice each week for the next 2-4 weeks. 05/26/17   Ward, Chase Picket, PA-C      Allergies    Etodolac, Macrolides and ketolides, Meloxicam, and Oxycontin [oxycodone hcl]    Review of Systems  Review of Systems  Constitutional:  Positive for activity change, appetite change, chills and fatigue. Negative for fever.  HENT:  Positive for congestion and sore throat.   Eyes:  Negative for visual disturbance.  Respiratory:  Positive for cough and shortness of breath (a little bit).   Cardiovascular:  Negative for chest pain.  Gastrointestinal:  Negative for abdominal pain, diarrhea, nausea and vomiting.  Genitourinary:  Negative for difficulty urinating.  Musculoskeletal:  Positive for arthralgias and myalgias. Negative for back pain and neck pain.  Skin:  Negative for rash.  Neurological:   Positive for headaches. Negative for syncope.   Physical Exam Updated Vital Signs BP (!) 147/89 (BP Location: Right Arm)    Pulse 86    Temp 98.5 F (36.9 C) (Oral)    Resp 18    Ht 5\' 5"  (1.651 m)    Wt 74.8 kg    LMP 08/07/2021    SpO2 99%    BMI 27.46 kg/m  Physical Exam Vitals and nursing note reviewed.  Constitutional:      General: She is not in acute distress.    Appearance: She is well-developed. She is not diaphoretic.  HENT:     Head: Normocephalic and atraumatic.  Eyes:     Conjunctiva/sclera: Conjunctivae normal.  Cardiovascular:     Rate and Rhythm: Normal rate and regular rhythm.     Heart sounds: Normal heart sounds. No murmur heard.   No friction rub. No gallop.  Pulmonary:     Effort: Pulmonary effort is normal. No respiratory distress.     Breath sounds: Normal breath sounds. No wheezing or rales.  Abdominal:     General: There is no distension.     Palpations: Abdomen is soft.     Tenderness: There is no abdominal tenderness. There is no guarding.  Musculoskeletal:        General: No tenderness.     Cervical back: Normal range of motion.  Skin:    General: Skin is warm and dry.     Findings: No erythema or rash.  Neurological:     Mental Status: She is alert and oriented to person, place, and time.    ED Results / Procedures / Treatments   Labs (all labs ordered are listed, but only abnormal results are displayed) Labs Reviewed  RESP PANEL BY RT-PCR (FLU A&B, COVID) ARPGX2 - Abnormal; Notable for the following components:      Result Value   SARS Coronavirus 2 by RT PCR POSITIVE (*)    All other components within normal limits    EKG None  Radiology No results found.  Procedures Procedures    Medications Ordered in ED Medications - No data to display  ED Course/ Medical Decision Making/ A&P                           Medical Decision Making   [phone] female with history of DM, hlpd, (htn per chart, not per pt not on anything, hx of  ectopic pregnancy surgery, acquired hypothyroidism , presents with cough, congestion, body aches for 2 days.   Low suspicion for pneumonia given combination of symptoms for 2 days, no hypoxia, no abnormal lung sounds on exam.    Suspect likely viral etiology of symptoms.    COVID 19 testing positive.   Had normal GFR in August.  Given rx for paxlovid and discussed appropriate to choose supportive care or use paxlovid if desired. Given  rx for tessalon, flexeril. Patient discharged in stable condition with understanding of reasons to return.           Final Clinical Impression(s) / ED Diagnoses Final diagnoses:  Viral URI with cough  Body aches  COVID-19    Rx / DC Orders ED Discharge Orders          Ordered    fluticasone (FLONASE) 50 MCG/ACT nasal spray  Daily        08/12/21 0848    benzonatate (TESSALON) 100 MG capsule  Every 8 hours        08/12/21 0848    cyclobenzaprine (FLEXERIL) 10 MG tablet  2 times daily PRN        08/12/21 0848    nirmatrelvir/ritonavir EUA (PAXLOVID) 20 x 150 MG & 10 x 100MG  TABS  2 times daily        08/12/21 [id number]              Gareth Morgan, MD 08/12/21 2209

## 2021-08-12 NOTE — ED Notes (Signed)
Pt discharged to home. Discharge instructions have been discussed with patient and/or family members. Pt verbally acknowledges understanding d/c instructions, and endorses comprehension to checkout at registration before leaving. Pt updated on necessary precautions r/t dx of Covid

## 2021-08-12 NOTE — Discharge Instructions (Addendum)
Pravastatin is ok to continue while you are on PAXLOVID (atorvastatin is not.)  Low blood sugars have been seen in patients taking glipizide and PAXLOVID, please monitor. Can discuss with pharmacist if you decide to fill this prescription.

## 2022-01-19 ENCOUNTER — Other Ambulatory Visit: Payer: Self-pay

## 2022-01-19 ENCOUNTER — Encounter (HOSPITAL_BASED_OUTPATIENT_CLINIC_OR_DEPARTMENT_OTHER): Payer: Self-pay | Admitting: Emergency Medicine

## 2022-01-19 ENCOUNTER — Emergency Department (HOSPITAL_BASED_OUTPATIENT_CLINIC_OR_DEPARTMENT_OTHER)
Admission: EM | Admit: 2022-01-19 | Discharge: 2022-01-19 | Disposition: A | Discharge status: Home / Self Care | Payer: BLUE CROSS/BLUE SHIELD | Attending: Emergency Medicine | Admitting: Emergency Medicine

## 2022-01-19 DIAGNOSIS — R81 Glycosuria: Secondary | ICD-10-CM | POA: Diagnosis not present

## 2022-01-19 DIAGNOSIS — Z7984 Long term (current) use of oral hypoglycemic drugs: Secondary | ICD-10-CM | POA: Diagnosis not present

## 2022-01-19 DIAGNOSIS — E119 Type 2 diabetes mellitus without complications: Secondary | ICD-10-CM | POA: Insufficient documentation

## 2022-01-19 DIAGNOSIS — R109 Unspecified abdominal pain: Secondary | ICD-10-CM | POA: Insufficient documentation

## 2022-01-19 DIAGNOSIS — R140 Abdominal distension (gaseous): Secondary | ICD-10-CM | POA: Insufficient documentation

## 2022-01-19 LAB — URINALYSIS, ROUTINE W REFLEX MICROSCOPIC
Bilirubin Urine: NEGATIVE
Glucose, UA: >=500 mg/dL — AB
Hgb urine dipstick: NEGATIVE
Ketones, ur: NEGATIVE mg/dL
Leukocytes,Ua: NEGATIVE
Nitrite: NEGATIVE
Protein, ur: NEGATIVE mg/dL
Specific Gravity, Urine: 1.02 (ref 1.005–1.030)
pH: 7.5 (ref 5.0–8.0)

## 2022-01-19 LAB — PREGNANCY, URINE: Preg Test, Ur: NEGATIVE

## 2022-01-19 LAB — URINALYSIS, MICROSCOPIC (REFLEX): RBC / HPF: NONE SEEN RBC/hpf (ref 0–5)

## 2022-01-19 MED ORDER — LIDOCAINE 5 % EX PTCH: 1.0000 | MEDICATED_PATCH | CUTANEOUS | 0 refills | Status: AC

## 2022-01-19 NOTE — ED Triage Notes (Signed)
Pt POV reports constant "tightness, pressure" in left flank. Denies pain, denies urinary problems. Denies heavy lifting. LBM 6/11.

## 2022-01-19 NOTE — Discharge Instructions (Addendum)
You do not have a urinary tract infection.  Pregnancy is negative.  You may follow-up with one of the outpatient PCPs to discuss any continued discomfort however I do suggest that you try heat packs, Tylenol and ibuprofen.  I have also sent lidocaine patches to your pharmacy that you may use and see if it helps your discomfort.  Only wear these for 12 hours at a time and make sure to have 12 hours patch free.  You have a large amount of sugar in your urine.  This is secondary to poorly controlled diabetes.  Do your best to keep your blood sugars within normal range.

## 2022-01-19 NOTE — ED Provider Notes (Incomplete)
[facility] EMERGENCY DEPARTMENT Provider Note   CSN: [id number] Arrival date & time: 01/19/22  1658     History {Add pertinent medical, surgical, social history, OB history to HPI:1} Chief Complaint  Patient presents with   Flank Pain    Shawna Coleman is a 46 y.o. female.  Off and on 3 days Lmp 6/4, emotions No lifting   Flank Pain       Home Medications Prior to Admission medications   Medication Sig Start Date End Date Taking? Authorizing Provider  amoxicillin (AMOXIL) 500 MG capsule Take 1 capsule (500 mg total) by mouth 3 (three) times daily. 11/15/17   Raeford Razor, MD  benzonatate (TESSALON) 100 MG capsule Take 1 capsule (100 mg total) by mouth every 8 (eight) hours. 08/12/21   Alvira Monday, MD  clindamycin (CLEOCIN) 150 MG capsule Take 2 capsules (300 mg total) by mouth 4 (four) times daily. 05/26/17   Ward, Chase Picket, PA-C  cyclobenzaprine (FLEXERIL) 10 MG tablet Take 1 tablet (10 mg total) by mouth 2 (two) times daily as needed for muscle spasms. 08/12/21   Alvira Monday, MD  doxycycline (VIBRAMYCIN) 100 MG capsule Take 1 capsule (100 mg total) by mouth 2 (two) times daily. 01/30/19   Raeford Razor, MD  famotidine (PEPCID) 20 MG tablet Take 1 tablet (20 mg total) by mouth 2 (two) times daily. Take 30 minutes before breakfast and dinner 02/18/21 03/20/21  Terald Sleeper, MD  fluconazole (DIFLUCAN) 150 MG tablet  07/13/20   [provider]  fluticasone (FLONASE) 50 MCG/ACT nasal spray Place 2 sprays into both nostrils daily for 5 days. 08/12/21 08/17/21  Alvira Monday, MD  glipiZIDE (GLUCOTROL XL) 10 MG 24 hr tablet Take by mouth.    [provider]  Insulin Aspart (NOVOLOG [place]) Inject into the skin.    [provider]  insulin lispro (HUMALOG) 100 UNIT/ML injection Inject into the skin 2 (two) times daily.    [provider]  metFORMIN (GLUCOPHAGE) 850 MG tablet Take by mouth.    [provider]   metroNIDAZOLE (FLAGYL) 500 MG tablet Take 1 tablet (500 mg total) by mouth 2 (two) times daily. One po bid x 7 days 09/18/18   Derwood Kaplan, MD  oxymetazoline (AFRIN NASAL SPRAY) 0.05 % nasal spray Place 1 spray into both nostrils 2 (two) times daily. 11/15/17   Raeford Razor, MD  pravastatin (PRAVACHOL) 20 MG tablet Take by mouth.    [provider]  selenium sulfide (SELSUN) 2.5 % shampoo Massage 5-10 mL into wet scalp. Leave on for 2-3 minutes, then rinse scalp and repeat.  Apply twice each week for the next 2-4 weeks. 05/26/17   Ward, Chase Picket, PA-C      Allergies    Etodolac, Macrolides and ketolides, Meloxicam, and Oxycontin [oxycodone hcl]    Review of Systems   Review of Systems  Genitourinary:  Positive for flank pain.    Physical Exam Updated Vital Signs BP (!) 145/92 (BP Location: Right Arm)   Pulse 73   Temp 98.7 F (37.1 C)   Resp 17   Ht 5\' 5"  (1.651 m)   Wt 74.8 kg   LMP 01/11/2022 (Exact Date)   SpO2 100%   BMI 27.46 kg/m  Physical Exam  ED Results / Procedures / Treatments   Labs (all labs ordered are listed, but only abnormal results are displayed) Labs Reviewed  URINALYSIS, ROUTINE W REFLEX MICROSCOPIC  PREGNANCY, URINE    EKG None  Radiology  No results found.  Procedures Procedures  {Document cardiac monitor, telemetry assessment procedure when appropriate:1}  Medications Ordered in ED Medications - No data to display  ED Course/ Medical Decision Making/ A&P                           Medical Decision Making Amount and/or Complexity of Data Reviewed Labs: ordered.   ***  {Document critical care time when appropriate:1} {Document review of labs and clinical decision tools ie heart score, Chads2Vasc2 etc:1}  {Document your independent review of radiology images, and any outside records:1} {Document your discussion with family members, caretakers, and with consultants:1} {Document social determinants of health affecting  pt's care:1} {Document your decision making why or why not admission, treatments were needed:1} Final Clinical Impression(s) / ED Diagnoses Final diagnoses:  None    Rx / DC Orders ED Discharge Orders     None

## 2022-01-19 NOTE — ED Provider Notes (Signed)
[facility] EMERGENCY DEPARTMENT Provider Note   CSN: [id number] Arrival date & time: 01/19/22  1658     History  Chief Complaint  Patient presents with   Flank Pain    Shawna Coleman is a 46 y.o. female with a past medical history of diabetes presenting today with left flank discomfort.  Says it feels more like tension and denies any pain.  Says that it comes and goes.  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  Did not pull a muscle.  Says it does not feel like gas.  Still having bowel movements regularly.  No fevers, chills or night sweats.  No dysuria, hematuria or vaginal symptoms.  Last menstrual period 6/5.  Says that her periods are irregular.  Reports that she knows she needs to drink more water and does not have a PCP.   Flank Pain       Home Medications Prior to Admission medications   Medication Sig Start Date End Date Taking? Authorizing Provider  amoxicillin (AMOXIL) 500 MG capsule Take 1 capsule (500 mg total) by mouth 3 (three) times daily. 11/15/17   Raeford RazorKohut, Stephen, MD  benzonatate (TESSALON) 100 MG capsule Take 1 capsule (100 mg total) by mouth every 8 (eight) hours. 08/12/21   Alvira MondaySchlossman, Erin, MD  clindamycin (CLEOCIN) 150 MG capsule Take 2 capsules (300 mg total) by mouth 4 (four) times daily. 05/26/17   Ward, Chase PicketJaime Pilcher, PA-C  cyclobenzaprine (FLEXERIL) 10 MG tablet Take 1 tablet (10 mg total) by mouth 2 (two) times daily as needed for muscle spasms. 08/12/21   Alvira MondaySchlossman, Erin, MD  doxycycline (VIBRAMYCIN) 100 MG capsule Take 1 capsule (100 mg total) by mouth 2 (two) times daily. 01/30/19   Raeford RazorKohut, Stephen, MD  famotidine (PEPCID) 20 MG tablet Take 1 tablet (20 mg total) by mouth 2 (two) times daily. Take 30 minutes before breakfast and dinner 02/18/21 03/20/21  Terald Sleeperrifan, Matthew J, MD  fluconazole (DIFLUCAN) 150 MG tablet  07/13/20   [provider]  fluticasone (FLONASE) 50 MCG/ACT nasal spray Place 2 sprays into both nostrils  daily for 5 days. 08/12/21 08/17/21  Alvira MondaySchlossman, Erin, MD  glipiZIDE (GLUCOTROL XL) 10 MG 24 hr tablet Take by mouth.    [provider]  Insulin Aspart (NOVOLOG [place]) Inject into the skin.    [provider]  insulin lispro (HUMALOG) 100 UNIT/ML injection Inject into the skin 2 (two) times daily.    [provider]  metFORMIN (GLUCOPHAGE) 850 MG tablet Take by mouth.    [provider]  metroNIDAZOLE (FLAGYL) 500 MG tablet Take 1 tablet (500 mg total) by mouth 2 (two) times daily. One po bid x 7 days 09/18/18   Derwood KaplanNanavati, Ankit, MD  oxymetazoline (AFRIN NASAL SPRAY) 0.05 % nasal spray Place 1 spray into both nostrils 2 (two) times daily. 11/15/17   Raeford RazorKohut, Stephen, MD  pravastatin (PRAVACHOL) 20 MG tablet Take by mouth.    [provider]  selenium sulfide (SELSUN) 2.5 % shampoo Massage 5-10 mL into wet scalp. Leave on for 2-3 minutes, then rinse scalp and repeat.  Apply twice each week for the next 2-4 weeks. 05/26/17   Ward, Chase PicketJaime Pilcher, PA-C      Allergies    Etodolac, Macrolides and ketolides, Meloxicam, and Oxycontin [oxycodone hcl]    Review of Systems   Review of Systems  Genitourinary:  Positive for flank pain.    Physical Exam Updated Vital Signs BP (!) 145/92 (BP Location:  Right Arm)   Pulse 73   Temp 98.7 F (37.1 C)   Resp 17   Ht 5\' 5"  (1.651 m)   Wt 74.8 kg   LMP 01/11/2022 (Exact Date)   SpO2 100%   BMI 27.46 kg/m  Physical Exam Vitals and nursing note reviewed.  Constitutional:      Appearance: Normal appearance.  HENT:     Head: Normocephalic and atraumatic.  Eyes:     General: No scleral icterus.    Conjunctiva/sclera: Conjunctivae normal.  Pulmonary:     Effort: Pulmonary effort is normal. No respiratory distress.  Abdominal:     General: Abdomen is flat. There is distension.     Palpations: Abdomen is soft.     Tenderness: There is no abdominal tenderness. There is no right CVA tenderness or left CVA tenderness.   Musculoskeletal:        General: Normal range of motion.     Comments: Normal range of motion of all levels of the spine.  No reproducible tenderness on the left lateral back and no midline tenderness.  Skin:    General: Skin is warm and dry.     Findings: No rash.  Neurological:     Mental Status: She is alert.  Psychiatric:        Mood and Affect: Mood normal.        Behavior: Behavior normal.     ED Results / Procedures / Treatments   Labs (all labs ordered are listed, but only abnormal results are displayed) Labs Reviewed  URINALYSIS, ROUTINE W REFLEX MICROSCOPIC - Abnormal; Notable for the following components:      Result Value   Glucose, UA >=500 (*)    All other components within normal limits  URINALYSIS, MICROSCOPIC (REFLEX) - Abnormal; Notable for the following components:   Bacteria, UA FEW (*)    All other components within normal limits  PREGNANCY, URINE    EKG None  Radiology No results found.  Procedures Procedures   Medications Ordered in ED Medications - No data to display  ED Course/ Medical Decision Making/ A&P                           Medical Decision Making Amount and/or Complexity of Data Reviewed Labs: ordered.   46 year old female presenting with left-sided "discomfort" that feels like tension.  No other symptoms.  No history of kidney stone.  Denies trauma.  Reports that this started out of nowhere. Differential includes but is not limited to UTI, pyelonephritis, kidney stone, musculoskeletal pain, radiculopathy dissection, constipation/gas.  Per chart review, patient is nonadherent to diabetic regimen.    Physical exam: Unremarkable.  No reproducible tenderness on physical exam, full range of motion's of the spine and negative CVA tenderness.  Abdomen was distended.  Work-up: Patient is without current symptoms.  Do not believe lab work will be of benefit with her symptoms.  She was offered plain film of her distended abdomen for  evaluation of potential constipation/gas but she said that it does not feel like that.  UA with glucosuria.  This is expected due to her uncontrolled diabetes.  Treatment: We discussed heat packs, lidocaine patches and NSAIDs.  She says that this does not feel musculoskeletal.  MDM/disposition: At this time I believe patient has been reasonably evaluated and is stable for discharge.  Labs are likely not to be of use.  Patient declined abdomen plain film.  She was offered CT  renal since she does not believe it is gas or musculoskeletal.  She did not want to be here all night and I told her I was unable to offer a timeline of imaging.  She says "I guess I will just go home."  This is reasonable, she will follow-up with outpatient PCP.  She does not have 1 so I sent in referrals to multiple offices.  She will see her gynecologist as planned tomorrow.   Final Clinical Impression(s) / ED Diagnoses Final diagnoses:  Left flank discomfort    Rx / DC Orders  Results and diagnoses were explained to the patient. Return precautions discussed in full. Patient had no additional questions and expressed complete understanding.   This chart was dictated using voice recognition software.  Despite best efforts to proofread,  errors can occur which can change the documentation meaning.    Saddie Benders, PA-C 01/19/22 1840    Melene Plan, DO 01/19/22 1901

## 2022-12-31 ENCOUNTER — Encounter (HOSPITAL_BASED_OUTPATIENT_CLINIC_OR_DEPARTMENT_OTHER): Payer: Self-pay | Admitting: Emergency Medicine

## 2022-12-31 ENCOUNTER — Emergency Department (HOSPITAL_BASED_OUTPATIENT_CLINIC_OR_DEPARTMENT_OTHER)
Admission: EM | Admit: 2022-12-31 | Discharge: 2022-12-31 | Disposition: A | Discharge status: Home / Self Care | Payer: 59 | Attending: Emergency Medicine | Admitting: Emergency Medicine

## 2022-12-31 ENCOUNTER — Other Ambulatory Visit: Payer: Self-pay

## 2022-12-31 DIAGNOSIS — Z7984 Long term (current) use of oral hypoglycemic drugs: Secondary | ICD-10-CM | POA: Diagnosis not present

## 2022-12-31 DIAGNOSIS — I10 Essential (primary) hypertension: Secondary | ICD-10-CM

## 2022-12-31 DIAGNOSIS — E119 Type 2 diabetes mellitus without complications: Secondary | ICD-10-CM | POA: Diagnosis not present

## 2022-12-31 DIAGNOSIS — Z79899 Other long term (current) drug therapy: Secondary | ICD-10-CM | POA: Diagnosis not present

## 2022-12-31 DIAGNOSIS — R519 Headache, unspecified: Secondary | ICD-10-CM | POA: Diagnosis present

## 2022-12-31 LAB — CBC
HCT: 38.7 % (ref 36.0–46.0)
Hemoglobin: 13 g/dL (ref 12.0–15.0)
MCH: 31.1 pg (ref 26.0–34.0)
MCHC: 33.6 g/dL (ref 30.0–36.0)
MCV: 92.6 fL (ref 80.0–100.0)
Platelets: 265 10*3/uL (ref 150–400)
RBC: 4.18 MIL/uL (ref 3.87–5.11)
RDW: 13.5 % (ref 11.5–15.5)
WBC: 5.3 10*3/uL (ref 4.0–10.5)
nRBC: 0 % (ref 0.0–0.2)

## 2022-12-31 LAB — BASIC METABOLIC PANEL
Anion gap: 6 (ref 5–15)
BUN: 8 mg/dL (ref 6–20)
CO2: 28 mmol/L (ref 22–32)
Calcium: 9.5 mg/dL (ref 8.9–10.3)
Chloride: 104 mmol/L (ref 98–111)
Creatinine, Ser: 0.81 mg/dL (ref 0.44–1.00)
GFR, Estimated: >60 mL/min (ref >60)
Glucose, Bld: 84 mg/dL (ref 70–99)
Potassium: 3.6 mmol/L (ref 3.5–5.1)
Sodium: 138 mmol/L (ref 135–145)

## 2022-12-31 LAB — CBG MONITORING, ED: Glucose-Capillary: 73 mg/dL (ref 70–99)

## 2022-12-31 MED ORDER — AMLODIPINE BESYLATE 2.5 MG PO TABS: 2.5000 mg | ORAL_TABLET | Freq: Every day | ORAL | 1 refills | Status: DC

## 2022-12-31 MED ORDER — AMLODIPINE BESYLATE 2.5 MG PO TABS: 2.5000 mg | ORAL_TABLET | Freq: Every day | ORAL | 1 refills | Status: AC

## 2022-12-31 NOTE — ED Provider Notes (Signed)
[place] EMERGENCY DEPARTMENT AT [facility] Provider Note   CSN: [id number] Arrival date & time: 12/31/22  1619     History  Chief Complaint  Patient presents with   Hypertension    Shawna Coleman is a 47 y.o. female.  47 year old female with a history of diabetes who presents to the emergency department with headache, flushing, and elevated blood pressure.  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  Sat down and notes her blood pressure was 210/102 at that time.  Went to urgent care and blood pressure had decreased to the 170 systolic.  Into the emergency department for evaluation with her elevated blood pressure which is new for her.  Since waiting in the emergency department developed a mild frontal headache as well.  Denies chest pain, shortness of breath, vision changes, numbness or weakness of her arms or legs.  Not on any antihypertensives at home.  Takes her blood pressure at home is typically in the 140s.       Home Medications Prior to Admission medications   Medication Sig Start Date End Date Taking? Authorizing Provider  amLODipine (NORVASC) 2.5 MG tablet Take 1 tablet (2.5 mg total) by mouth daily. 12/31/22 01/30/23  Rondel Baton, MD  amoxicillin (AMOXIL) 500 MG capsule Take 1 capsule (500 mg total) by mouth 3 (three) times daily. 11/15/17   Raeford Razor, MD  benzonatate (TESSALON) 100 MG capsule Take 1 capsule (100 mg total) by mouth every 8 (eight) hours. 08/12/21   Alvira Monday, MD  clindamycin (CLEOCIN) 150 MG capsule Take 2 capsules (300 mg total) by mouth 4 (four) times daily. 05/26/17   Ward, Chase Picket, PA-C  cyclobenzaprine (FLEXERIL) 10 MG tablet Take 1 tablet (10 mg total) by mouth 2 (two) times daily as needed for muscle spasms. 08/12/21   Alvira Monday, MD  doxycycline (VIBRAMYCIN) 100 MG capsule Take 1 capsule (100 mg total) by mouth 2 (two) times daily.  01/30/19   Raeford Razor, MD  famotidine (PEPCID) 20 MG tablet Take 1 tablet (20 mg total) by mouth 2 (two) times daily. Take 30 minutes before breakfast and dinner 02/18/21 03/20/21  Terald Sleeper, MD  fluconazole (DIFLUCAN) 150 MG tablet  07/13/20   [provider]  fluticasone (FLONASE) 50 MCG/ACT nasal spray Place 2 sprays into both nostrils daily for 5 days. 08/12/21 08/17/21  Alvira Monday, MD  glipiZIDE (GLUCOTROL XL) 10 MG 24 hr tablet Take by mouth.    [provider]  Insulin Aspart (NOVOLOG [place]) Inject into the skin.    [provider]  insulin lispro (HUMALOG) 100 UNIT/ML injection Inject into the skin 2 (two) times daily.    [provider]  lidocaine (LIDODERM) 5 % Place 1 patch onto the skin daily. Remove & Discard patch within 12 hours or as directed by MD 01/19/22   Redwine, Madison A, PA-C  metFORMIN (GLUCOPHAGE) 850 MG tablet Take by mouth.    [provider]  metroNIDAZOLE (FLAGYL) 500 MG tablet Take 1 tablet (500 mg total) by mouth 2 (two) times daily. One po bid x 7 days 09/18/18   Derwood Kaplan, MD  oxymetazoline (AFRIN NASAL SPRAY) 0.05 % nasal spray Place 1 spray into both nostrils 2 (two) times daily. 11/15/17   Raeford Razor, MD  pravastatin (PRAVACHOL) 20 MG tablet Take by mouth.    [provider]  selenium sulfide (SELSUN) 2.5 % shampoo Massage 5-10  mL into wet scalp. Leave on for 2-3 minutes, then rinse scalp and repeat.  Apply twice each week for the next 2-4 weeks. 05/26/17   Ward, Chase Picket, PA-C      Allergies    Hydrocodone, Etodolac, Macrolides and ketolides, Meloxicam, Nitrofurantoin, and Oxycontin [oxycodone hcl]    Review of Systems   Review of Systems  Physical Exam Updated Vital Signs BP (!) 151/68   Pulse 63   Temp 98.3 F (36.8 C) (Oral)   Resp 16   Ht 5\' 5"  (1.651 m)   Wt 76.7 kg   SpO2 100%   BMI 28.12 kg/m  Physical Exam Vitals and nursing note reviewed.  Constitutional:       General: She is not in acute distress.    Appearance: She is well-developed.  HENT:     Head: Normocephalic and atraumatic.     Right Ear: External ear normal.     Left Ear: External ear normal.     Nose: Nose normal.  Eyes:     Extraocular Movements: Extraocular movements intact.     Conjunctiva/sclera: Conjunctivae normal.     Pupils: Pupils are equal, round, and reactive to light.  Cardiovascular:     Rate and Rhythm: Normal rate and regular rhythm.     Heart sounds: No murmur heard. Pulmonary:     Effort: Pulmonary effort is normal. No respiratory distress.     Breath sounds: Normal breath sounds.  Abdominal:     General: Abdomen is flat.     Palpations: Abdomen is soft.  Musculoskeletal:     Cervical back: Normal range of motion and neck supple.     Right lower leg: No edema.     Left lower leg: No edema.  Skin:    General: Skin is warm and dry.  Neurological:     Mental Status: She is alert and oriented to person, place, and time. Mental status is at baseline.     Comments: MENTAL STATUS: AAOx3 CRANIAL NERVES: II: Pupils equal and reactive 4 mm BL, no RAPD, no VF deficits III, IV, VI: EOM intact, no gaze preference or deviation, no nystagmus. V: normal sensation to light touch in V1, V2, and V3 segments bilaterally VII: no facial weakness or asymmetry, no nasolabial fold flattening VIII: normal hearing to speech and finger friction IX, X: normal palatal elevation, no uvular deviation XI: 5/5 head turn and 5/5 shoulder shrug bilaterally XII: midline tongue protrusion MOTOR: 5/5 strength in R shoulder flexion, elbow flexion and extension, and grip strength. 5/5 strength in L shoulder flexion, elbow flexion and extension, and grip strength.  5/5 strength in R hip and knee flexion, knee extension, ankle plantar and dorsiflexion. 5/5 strength in L hip and knee flexion, knee extension, ankle plantar and dorsiflexion. SENSORY: Normal sensation to light touch in all  extremities COORD: Normal finger to nose and heel to shin, no tremor, no dysmetria STATION: normal stance, no truncal ataxia GAIT: Normal   Psychiatric:        Mood and Affect: Mood normal.     ED Results / Procedures / Treatments   Labs (all labs ordered are listed, but only abnormal results are displayed) Labs Reviewed  CBC  BASIC METABOLIC PANEL  CBG MONITORING, ED    EKG EKG Interpretation  Date/Time:  Thursday Dec 31 2022 18:22:59 EDT Ventricular Rate:  62 PR Interval:  122 QRS Duration: 83 QT Interval:  388 QTC Calculation: 394 R Axis:   78 Text Interpretation: Sinus  rhythm Confirmed by Vonita Moss [phone]) on 12/31/2022 6:26:06 PM  Radiology No results found.  Procedures Procedures    Medications Ordered in ED Medications - No data to display  ED Course/ Medical Decision Making/ A&P                             Medical Decision Making Amount and/or Complexity of Data Reviewed Labs: ordered.  Risk Prescription drug management.   Shawna Coleman is a 47 y.o. female with comorbidities that complicate the patient evaluation including diabetes presents emergency department with elevated blood pressure, presyncopal event, and headache  Initial Ddx:  Hypertensive emergency, ICH, stroke, MI, arrhythmia, hyperthyroidism, pheochromocytoma, allergic reaction  MDM:  Patient blood pressure spontaneously proved here in the emergency department.  Has a nonfocal neurologic exam do not feel that head imaging is warranted.  No symptoms that would be concerning for MI.  As such do not feel that the patient is having a hypertensive emergency.  Unclear exactly what is causing her symptoms but may have been some sort of a reaction to the allergens that she was exposed to cause her to be very uncomfortable and hypertensive.  No other symptoms that would be concerning for pheochromocytoma or hypothyroidism.  Low concern for allergic reaction at this time.  Plan:   Labs EKG  ED Summary/Re-evaluation:  Labs and EKG were unremarkable.  Did discuss starting the patient on amlodipine but she said that with her blood pressure spontaneously improving she preferred to watch it.  Did give her a prescription for amlodipine and instructions on how to take her blood pressure and reported that if her blood pressure remains persistently above 140 systolic at home to fill the prescription for the amlodipine and start taking.  Instructed her to follow-up with her primary doctor in several days regarding her symptoms.  This patient presents to the ED for concern of complaints listed in HPI, this involves an extensive number of treatment options, and is a complaint that carries with it a high risk of complications and morbidity. Disposition including potential need for admission considered.   Dispo: DC Home. Return precautions discussed including, but not limited to, those listed in the AVS. Allowed pt time to ask questions which were answered fully prior to dc.  Records reviewed Outpatient Clinic Notes The following labs were independently interpreted: Chemistry and show no acute abnormality I personally reviewed and interpreted cardiac monitoring: normal sinus rhythm  I personally reviewed and interpreted the pt's EKG: see above for interpretation  I have reviewed the patients home medications and made adjustments as needed        Final Clinical Impression(s) / ED Diagnoses Final diagnoses:  Hypertension, unspecified type    Rx / DC Orders ED Discharge Orders          Ordered    amLODipine (NORVASC) 2.5 MG tablet  Daily,   Status:  Discontinued        12/31/22 1827    amLODipine (NORVASC) 2.5 MG tablet  Daily        12/31/22 1827              Rondel Baton, MD 01/01/23 1301

## 2022-12-31 NOTE — Discharge Instructions (Signed)
You were seen for your elevated blood pressure in the emergency department.   At home, please continue to take your blood pressure.  Take the amlodipine that we have prescribed if your blood pressure is consistently above 140 mmHg.    Check your MyChart online for the results of any tests that had not resulted by the time you left the emergency department.   Follow-up with your primary doctor in 2-3 days regarding your visit.  If you do not have a primary care doctor you may follow-up with [facility] which is listed in this packet.  Return immediately to the emergency department if you experience any of the following: Chest pain, shortness of breath, or any other concerning symptoms.    Thank you for visiting our Emergency Department. It was a pleasure taking care of you today.

## 2022-12-31 NOTE — ED Triage Notes (Signed)
Pt arrives to ED with c/o hypertension. Pt notes her BP was 210/102 at work and 170s systolic at [facility]. Associated headache, flushing, and jittery that started today after lunch.

## 2022-12-31 NOTE — ED Notes (Signed)
Reviewed AVS/discharge instruction with patient. Time allotted for and all questions answered. Patient is agreeable for d/c and escorted to ed exit by staff.  

## 2023-01-20 ENCOUNTER — Encounter (HOSPITAL_BASED_OUTPATIENT_CLINIC_OR_DEPARTMENT_OTHER): Payer: Self-pay | Admitting: Emergency Medicine

## 2023-01-20 ENCOUNTER — Other Ambulatory Visit: Payer: Self-pay

## 2023-01-20 ENCOUNTER — Emergency Department (HOSPITAL_BASED_OUTPATIENT_CLINIC_OR_DEPARTMENT_OTHER)
Admission: EM | Admit: 2023-01-20 | Discharge: 2023-01-20 | Disposition: A | Discharge status: Home / Self Care | Payer: 59 | Attending: Emergency Medicine | Admitting: Emergency Medicine

## 2023-01-20 DIAGNOSIS — E119 Type 2 diabetes mellitus without complications: Secondary | ICD-10-CM | POA: Diagnosis not present

## 2023-01-20 DIAGNOSIS — Z7984 Long term (current) use of oral hypoglycemic drugs: Secondary | ICD-10-CM | POA: Insufficient documentation

## 2023-01-20 DIAGNOSIS — R350 Frequency of micturition: Secondary | ICD-10-CM | POA: Insufficient documentation

## 2023-01-20 DIAGNOSIS — I10 Essential (primary) hypertension: Secondary | ICD-10-CM | POA: Insufficient documentation

## 2023-01-20 DIAGNOSIS — Z794 Long term (current) use of insulin: Secondary | ICD-10-CM | POA: Diagnosis not present

## 2023-01-20 DIAGNOSIS — Z79899 Other long term (current) drug therapy: Secondary | ICD-10-CM | POA: Diagnosis not present

## 2023-01-20 DIAGNOSIS — E039 Hypothyroidism, unspecified: Secondary | ICD-10-CM | POA: Diagnosis not present

## 2023-01-20 LAB — URINALYSIS, ROUTINE W REFLEX MICROSCOPIC
Bacteria, UA: NONE SEEN
Bilirubin Urine: NEGATIVE
Glucose, UA: >1000 mg/dL — AB
Hgb urine dipstick: NEGATIVE
Ketones, ur: NEGATIVE mg/dL
Leukocytes,Ua: NEGATIVE
Nitrite: NEGATIVE
Protein, ur: NEGATIVE mg/dL
Specific Gravity, Urine: 1.033 — ABNORMAL HIGH (ref 1.005–1.030)
pH: 6.5 (ref 5.0–8.0)

## 2023-01-20 LAB — CBG MONITORING, ED: Glucose-Capillary: 230 mg/dL — ABNORMAL HIGH (ref 70–99)

## 2023-01-20 NOTE — Discharge Instructions (Signed)
Thank you for allowing me be part of your care today.  Please follow-up with your primary care provider regarding your urinary symptoms.  I also recommend that you follow-up with your endocrinologist.  If you continue to have symptoms or develop new symptoms such as painful urination, itching, burning, foul urinary odor, please be reevaluated as you may have developed infection.

## 2023-01-20 NOTE — ED Provider Notes (Signed)
[place] EMERGENCY DEPARTMENT AT [facility] Provider Note   CSN: [id number] Arrival date & time: 01/20/23  1840     History  Chief Complaint  Patient presents with   Urinary Frequency    Shawna Coleman is a 47 y.o. female with past medical history significant for hypertension, hyperlipidemia, uncontrolled type 2 diabetes, acquired hypothyroidism presents to the ED complaining of urinary frequency and urgency with concern of UTI.  Patient states that she has had the symptoms for the past 3 days.  Denies flank pain, dysuria, foul urinary odor, abdominal pain, hematuria.       Home Medications Prior to Admission medications   Medication Sig Start Date End Date Taking? Authorizing Provider  amLODipine (NORVASC) 2.5 MG tablet Take 1 tablet (2.5 mg total) by mouth daily. 12/31/22 01/30/23  Rondel Baton, MD  amoxicillin (AMOXIL) 500 MG capsule Take 1 capsule (500 mg total) by mouth 3 (three) times daily. 11/15/17   Raeford Razor, MD  benzonatate (TESSALON) 100 MG capsule Take 1 capsule (100 mg total) by mouth every 8 (eight) hours. 08/12/21   Alvira Monday, MD  clindamycin (CLEOCIN) 150 MG capsule Take 2 capsules (300 mg total) by mouth 4 (four) times daily. 05/26/17   Ward, Chase Picket, PA-C  cyclobenzaprine (FLEXERIL) 10 MG tablet Take 1 tablet (10 mg total) by mouth 2 (two) times daily as needed for muscle spasms. 08/12/21   Alvira Monday, MD  doxycycline (VIBRAMYCIN) 100 MG capsule Take 1 capsule (100 mg total) by mouth 2 (two) times daily. 01/30/19   Raeford Razor, MD  famotidine (PEPCID) 20 MG tablet Take 1 tablet (20 mg total) by mouth 2 (two) times daily. Take 30 minutes before breakfast and dinner 02/18/21 03/20/21  Terald Sleeper, MD  fluconazole (DIFLUCAN) 150 MG tablet  07/13/20   [provider]  fluticasone (FLONASE) 50 MCG/ACT nasal spray Place 2 sprays into both nostrils daily for 5 days. 08/12/21 08/17/21  Alvira Monday, MD  glipiZIDE (GLUCOTROL  XL) 10 MG 24 hr tablet Take by mouth.    [provider]  Insulin Aspart (NOVOLOG [place]) Inject into the skin.    [provider]  insulin lispro (HUMALOG) 100 UNIT/ML injection Inject into the skin 2 (two) times daily.    [provider]  lidocaine (LIDODERM) 5 % Place 1 patch onto the skin daily. Remove & Discard patch within 12 hours or as directed by MD 01/19/22   Redwine, Madison A, PA-C  metFORMIN (GLUCOPHAGE) 850 MG tablet Take by mouth.    [provider]  metroNIDAZOLE (FLAGYL) 500 MG tablet Take 1 tablet (500 mg total) by mouth 2 (two) times daily. One po bid x 7 days 09/18/18   Derwood Kaplan, MD  oxymetazoline (AFRIN NASAL SPRAY) 0.05 % nasal spray Place 1 spray into both nostrils 2 (two) times daily. 11/15/17   Raeford Razor, MD  pravastatin (PRAVACHOL) 20 MG tablet Take by mouth.    [provider]  selenium sulfide (SELSUN) 2.5 % shampoo Massage 5-10 mL into wet scalp. Leave on for 2-3 minutes, then rinse scalp and repeat.  Apply twice each week for the next 2-4 weeks. 05/26/17   Ward, Chase Picket, PA-C      Allergies    Hydrocodone, Etodolac, Macrolides and ketolides, Meloxicam, Nitrofurantoin, and Oxycontin [oxycodone hcl]    Review of Systems   Review of Systems  Constitutional:  Negative for fever.  Gastrointestinal:  Negative for abdominal pain.  Genitourinary:  Positive for frequency and  urgency. Negative for difficulty urinating, dysuria and flank pain.    Physical Exam Updated Vital Signs BP (!) 145/83   Pulse 77   Temp 98 F (36.7 C) (Oral)   Resp 18   SpO2 100%  Physical Exam Vitals and nursing note reviewed.  Constitutional:      General: She is not in acute distress.    Appearance: Normal appearance. She is not ill-appearing or diaphoretic.  Cardiovascular:     Rate and Rhythm: Normal rate and regular rhythm.  Pulmonary:     Effort: Pulmonary effort is normal.  Abdominal:     General: Abdomen is flat.      Palpations: Abdomen is soft.     Tenderness: There is no abdominal tenderness.  Skin:    General: Skin is warm and dry.     Capillary Refill: Capillary refill takes less than 2 seconds.  Neurological:     Mental Status: She is alert. Mental status is at baseline.  Psychiatric:        Mood and Affect: Mood normal.        Behavior: Behavior normal.     ED Results / Procedures / Treatments   Labs (all labs ordered are listed, but only abnormal results are displayed) Labs Reviewed  URINALYSIS, ROUTINE W REFLEX MICROSCOPIC - Abnormal; Notable for the following components:      Result Value   Color, Urine COLORLESS (*)    Specific Gravity, Urine 1.033 (*)    Glucose, UA >1,000 (*)    All other components within normal limits  CBG MONITORING, ED - Abnormal; Notable for the following components:   Glucose-Capillary 230 (*)    All other components within normal limits    EKG None  Radiology No results found.  Procedures Procedures    Medications Ordered in ED Medications - No data to display  ED Course/ Medical Decision Making/ A&P                             Medical Decision Making Amount and/or Complexity of Data Reviewed Labs: ordered.   This patient presents to the ED with chief complaint(s) of urinary frequency and urgency with pertinent past medical history of uncontrolled type 2 diabetes.  The complaint involves an extensive differential diagnosis and also carries with it a high risk of complications and morbidity.    The differential diagnosis includes acute cystitis, candiduria, overactive bladder, interstitial cystitis, uncontrolled diabetes  The initial plan is to obtain UA  Additional history obtained: Records reviewed  endocrinology, patient's last A1c was 8.7.  Dr. Eliane Decree note states patient's diabetes control was worsening, patient has been drinking excessive alcohol, but patient stopped drinking and smoking.  Patient now on Jardiance, glipizide, 75/25  insulin.  Patient reports not checking her blood sugars regularly.  Initial Assessment:   Exam significant for overall well-appearing patient who is not in acute distress.  Abdomen soft, nontender, and not distended.  Skin is warm and dry.  Mucous membranes are moist.  Independent ECG/labs interpretation:  The following labs were independently interpreted:  UA with glucosuria, no evidence of infection.  CBG 230.  Disposition:   Discussed with patient that she does not have evidence of an active UTI.  Patient's increased urinary frequency may be related to her diabetes.  Patient admits to increased sugar consumption.  Patient does take medications that cause glucosuria, body may be attempting to waste extra sugar if patient has had  increased carbohydrate and sugar consumption.  Recommended that patient follow-up with her primary care provider as well as endocrinology.    Patient left after completion of care, but prior to receiving her after visit summary.         Final Clinical Impression(s) / ED Diagnoses Final diagnoses:  Urinary frequency    Rx / DC Orders ED Discharge Orders     None         Lenard Simmer, PA-C 01/20/23 2016    Maia Plan, MD 01/23/23 [phone]

## 2023-01-20 NOTE — ED Triage Notes (Signed)
Pt here from home with c/o urinary frequency and urgency

## 2023-01-20 NOTE — ED Notes (Signed)
Pt. Came to desk and stated she was leaving and walked out

## 2023-01-23 ENCOUNTER — Encounter (HOSPITAL_BASED_OUTPATIENT_CLINIC_OR_DEPARTMENT_OTHER): Payer: Self-pay | Admitting: Emergency Medicine

## 2023-01-23 ENCOUNTER — Other Ambulatory Visit: Payer: Self-pay

## 2023-01-23 ENCOUNTER — Emergency Department (HOSPITAL_BASED_OUTPATIENT_CLINIC_OR_DEPARTMENT_OTHER)
Admission: EM | Admit: 2023-01-23 | Discharge: 2023-01-23 | Disposition: A | Discharge status: Home / Self Care | Payer: 59 | Attending: Emergency Medicine | Admitting: Emergency Medicine

## 2023-01-23 DIAGNOSIS — Z7984 Long term (current) use of oral hypoglycemic drugs: Secondary | ICD-10-CM | POA: Diagnosis not present

## 2023-01-23 DIAGNOSIS — E119 Type 2 diabetes mellitus without complications: Secondary | ICD-10-CM | POA: Diagnosis not present

## 2023-01-23 DIAGNOSIS — R1084 Generalized abdominal pain: Secondary | ICD-10-CM

## 2023-01-23 DIAGNOSIS — R3915 Urgency of urination: Secondary | ICD-10-CM | POA: Diagnosis not present

## 2023-01-23 DIAGNOSIS — Z794 Long term (current) use of insulin: Secondary | ICD-10-CM | POA: Insufficient documentation

## 2023-01-23 DIAGNOSIS — R350 Frequency of micturition: Secondary | ICD-10-CM | POA: Diagnosis not present

## 2023-01-23 LAB — URINALYSIS, ROUTINE W REFLEX MICROSCOPIC
Bilirubin Urine: NEGATIVE
Glucose, UA: >=500 mg/dL — AB
Hgb urine dipstick: NEGATIVE
Ketones, ur: NEGATIVE mg/dL
Leukocytes,Ua: NEGATIVE
Nitrite: NEGATIVE
Protein, ur: NEGATIVE mg/dL
Specific Gravity, Urine: <=1.005 (ref 1.005–1.030)
pH: 6 (ref 5.0–8.0)

## 2023-01-23 LAB — WET PREP, GENITAL
Clue Cells Wet Prep HPF POC: NONE SEEN
Sperm: NONE SEEN
Trich, Wet Prep: NONE SEEN
WBC, Wet Prep HPF POC: <10 (ref <10)
Yeast Wet Prep HPF POC: NONE SEEN

## 2023-01-23 LAB — URINALYSIS, MICROSCOPIC (REFLEX): RBC / HPF: NONE SEEN RBC/hpf (ref 0–5)

## 2023-01-23 LAB — PREGNANCY, URINE: Preg Test, Ur: NEGATIVE

## 2023-01-23 LAB — CBG MONITORING, ED: Glucose-Capillary: 108 mg/dL — ABNORMAL HIGH (ref 70–99)

## 2023-01-23 NOTE — ED Provider Notes (Signed)
[place] [facility]ERGENCY DEPARTMENT AT [facility] Provider Note   CSN: [id number] Arrival date & time: 01/23/23  1058     History  Chief Complaint  Patient presents with   Abdominal Pain    Shawna Coleman is a 47 y.o. female with a past medical history of diabetes who presents emergency department with concerns for dysuria x 1 week.  Also voices concern for diffuse abdominal pain/pressure.  Notes that her last bowel movement was yesterday and it was not a complete bowel movement for her.  Notes that she typically has bowel movements weekly with her last bowel movement being last week.  She took a laxative 1 week ago and noted diarrhea following however that resolved 6 days ago.  Has associated frequency, urgency.  Also voices concern for a yeast infection.  Notes that she used a boric acid suppository and was concerned due to seeing yeast when she removed the suppository.  Denies nausea, vomiting, fever.  The history is provided by the patient. No language interpreter was used.       Home Medications Prior to Admission medications   Medication Sig Start Date End Date Taking? Authorizing Provider  amLODipine (NORVASC) 2.5 MG tablet Take 1 tablet (2.5 mg total) by mouth daily. 12/31/22 01/30/23  Rondel Baton, MD  amoxicillin (AMOXIL) 500 MG capsule Take 1 capsule (500 mg total) by mouth 3 (three) times daily. 11/15/17   Raeford Razor, MD  benzonatate (TESSALON) 100 MG capsule Take 1 capsule (100 mg total) by mouth every 8 (eight) hours. 08/12/21   Alvira Monday, MD  clindamycin (CLEOCIN) 150 MG capsule Take 2 capsules (300 mg total) by mouth 4 (four) times daily. 05/26/17   Ward, Chase Picket, PA-C  cyclobenzaprine (FLEXERIL) 10 MG tablet Take 1 tablet (10 mg total) by mouth 2 (two) times daily as needed for muscle spasms. 08/12/21   Alvira Monday, MD  doxycycline (VIBRAMYCIN) 100 MG capsule Take 1 capsule (100 mg total) by mouth 2 (two) times daily. 01/30/19   Raeford Razor, MD  famotidine (PEPCID) 20 MG tablet Take 1 tablet (20 mg total) by mouth 2 (two) times daily. Take 30 minutes before breakfast and dinner 02/18/21 03/20/21  Terald Sleeper, MD  fluconazole (DIFLUCAN) 150 MG tablet  07/13/20   [provider]  fluticasone (FLONASE) 50 MCG/ACT nasal spray Place 2 sprays into both nostrils daily for 5 days. 08/12/21 08/17/21  Alvira Monday, MD  glipiZIDE (GLUCOTROL XL) 10 MG 24 hr tablet Take by mouth.    [provider]  Insulin Aspart (NOVOLOG [place]) Inject into the skin.    [provider]  insulin lispro (HUMALOG) 100 UNIT/ML injection Inject into the skin 2 (two) times daily.    [provider]  lidocaine (LIDODERM) 5 % Place 1 patch onto the skin daily. Remove & Discard patch within 12 hours or as directed by MD 01/19/22   Redwine, Madison A, PA-C  metFORMIN (GLUCOPHAGE) 850 MG tablet Take by mouth.    [provider]  metroNIDAZOLE (FLAGYL) 500 MG tablet Take 1 tablet (500 mg total) by mouth 2 (two) times daily. One po bid x 7 days 09/18/18   Derwood Kaplan, MD  oxymetazoline (AFRIN NASAL SPRAY) 0.05 % nasal spray Place 1 spray into both nostrils 2 (two) times daily. 11/15/17   Raeford Razor, MD  pravastatin (PRAVACHOL) 20 MG tablet Take by mouth.    [provider]  selenium sulfide (SELSUN) 2.5 % shampoo Massage 5-10 mL into  wet scalp. Leave on for 2-3 minutes, then rinse scalp and repeat.  Apply twice each week for the next 2-4 weeks. 05/26/17   Ward, Chase Picket, PA-C      Allergies    Hydrocodone, Etodolac, Macrolides and ketolides, Meloxicam, and Oxycontin [oxycodone hcl]    Review of Systems   Review of Systems  Constitutional:  Negative for fever.  Gastrointestinal:  Positive for abdominal pain. Negative for constipation, nausea and vomiting.  All other systems reviewed and are negative.   Physical Exam Updated Vital Signs BP (!) 152/78 (BP Location: Right Arm)   Pulse 76   Temp 98.4  F (36.9 C) (Oral)   Resp 18   Ht 5\' 5"  (1.651 m)   Wt 76.7 kg   SpO2 100%   BMI 28.12 kg/m  Physical Exam Vitals and nursing note reviewed.  Constitutional:      General: She is not in acute distress.    Appearance: She is not diaphoretic.  HENT:     Head: Normocephalic and atraumatic.     Mouth/Throat:     Pharynx: No oropharyngeal exudate.  Eyes:     General: No scleral icterus.    Conjunctiva/sclera: Conjunctivae normal.  Cardiovascular:     Rate and Rhythm: Normal rate and regular rhythm.     Pulses: Normal pulses.     Heart sounds: Normal heart sounds.  Pulmonary:     Effort: Pulmonary effort is normal. No respiratory distress.     Breath sounds: Normal breath sounds. No wheezing.  Abdominal:     General: Bowel sounds are normal.     Palpations: Abdomen is soft. There is no mass.     Tenderness: There is generalized abdominal tenderness. There is no guarding or rebound.     Comments: Diffuse abdominal tenderness to palpation  Musculoskeletal:        General: Normal range of motion.     Cervical back: Normal range of motion and neck supple.  Skin:    General: Skin is warm and dry.  Neurological:     Mental Status: She is alert.  Psychiatric:        Behavior: Behavior normal.     ED Results / Procedures / Treatments   Labs (all labs ordered are listed, but only abnormal results are displayed) Labs Reviewed  URINALYSIS, ROUTINE W REFLEX MICROSCOPIC - Abnormal; Notable for the following components:      Result Value   Color, Urine STRAW (*)    Glucose, UA >=500 (*)    All other components within normal limits  URINALYSIS, MICROSCOPIC (REFLEX) - Abnormal; Notable for the following components:   Bacteria, UA FEW (*)    All other components within normal limits  CBG MONITORING, ED - Abnormal; Notable for the following components:   Glucose-Capillary 108 (*)    All other components within normal limits  WET PREP, GENITAL  PREGNANCY, URINE     EKG None  Radiology No results found.  Procedures Procedures    Medications Ordered in ED Medications - No data to display  ED Course/ Medical Decision Making/ A&P Clinical Course as of 01/23/23 1544  Sat Jan 23, 2023  1256 Notified by RN that patient doesn't want to proceed with CT and labs. Discussed with patient regarding use of CT scan, patient declines at this time. Pt would like to proceed with wet prep only at this time.  [SB]  1351 Discussed with patient swab results and discharge treatment plan. Answered all available questions.  Pt appears safe for discharge at this time.  [SB]    Clinical Course User Index [SB] Jareth Pardee A, PA-C                             Medical Decision Making Amount and/or Complexity of Data Reviewed Labs: ordered. Radiology: ordered.   Patient presents to the emergency department with concerns for dysuria x 1 week. Pt afebrile. On exam patient with diffuse abdominal tenderness to palpation.  No focal abdominal pain.  Remainder of exam without acute findings. Differential diagnosis includes pancreatitis, cholecystitis, appendicitis, acute cystitis.   Labs:  I ordered, and personally interpreted labs.  The pertinent results include:  Wet prep negative UA with >500 glucose otherwise unremarkable.  CBG at 108 Negative pregnancy urine.    Disposition: Test suspicious for urinary frequency no generalized abdominal pain.  Patient declines with CT scan or labs at this time.  Discussed with patient in depth regarding treatment of her constipation with MiraLAX, senna tea, water, increasing fiber. After consideration of the diagnostic results and the patients response to treatment, I feel that the patient would benefit from Discharge home.  Instructed patient to follow-up with her endocrinologist regarding today's ED visit.  Supportive care measures and strict return precautions discussed with patient at bedside. Pt acknowledges and verbalizes  understanding. Pt appears safe for discharge. Follow up as indicated in discharge paperwork.   This chart was dictated using voice recognition software, Dragon. Despite the best efforts of this provider to proofread and correct errors, errors may still occur which can change documentation meaning.   Final Clinical Impression(s) / ED Diagnoses Final diagnoses:  Generalized abdominal pain  Urinary frequency    Rx / DC Orders ED Discharge Orders     None         Riker Collier A, PA-C 01/23/23 1544    Ernie Avena, MD 01/24/23 [phone]

## 2023-01-23 NOTE — ED Notes (Signed)
Pt requesting to be DC, MD notified

## 2023-01-23 NOTE — Discharge Instructions (Signed)
It was a pleasure taking care of you today!  Your swab today did not show any concerns for use.  Your urine today did not show any concerns for a bladder infection.  Continue to take your medications as prescribed.  Follow-up with your endocrinologist next week for medication management.  Return to the emergency department if you experience increasing/worsening symptoms.

## 2023-01-23 NOTE — ED Triage Notes (Signed)
Dysuria for one week.  Pt also thinks she may have yeast infection or constipation.  No known fever.  Last BM yesterday.

## 2023-01-23 NOTE — ED Notes (Signed)
Started putting in IV and in middle of IV start pt said she does not want bloodwork or a CT scan. Says she wants to go home. Message sent to PA.

## 2023-01-25 LAB — GC/CHLAMYDIA PROBE AMP (~~LOC~~) NOT AT ARMC
Chlamydia: NEGATIVE
Comment: NEGATIVE
Comment: NORMAL
Neisseria Gonorrhea: NEGATIVE

## 2024-07-23 ENCOUNTER — Encounter (HOSPITAL_BASED_OUTPATIENT_CLINIC_OR_DEPARTMENT_OTHER): Payer: Self-pay | Admitting: Emergency Medicine

## 2024-07-23 ENCOUNTER — Other Ambulatory Visit: Payer: Self-pay

## 2024-07-23 ENCOUNTER — Emergency Department (HOSPITAL_BASED_OUTPATIENT_CLINIC_OR_DEPARTMENT_OTHER)
Admission: EM | Admit: 2024-07-23 | Discharge: 2024-07-24 | Disposition: A | Attending: Emergency Medicine | Admitting: Emergency Medicine

## 2024-07-23 DIAGNOSIS — R059 Cough, unspecified: Secondary | ICD-10-CM | POA: Diagnosis present

## 2024-07-23 DIAGNOSIS — I10 Essential (primary) hypertension: Secondary | ICD-10-CM | POA: Insufficient documentation

## 2024-07-23 DIAGNOSIS — J069 Acute upper respiratory infection, unspecified: Secondary | ICD-10-CM | POA: Insufficient documentation

## 2024-07-23 DIAGNOSIS — Z7984 Long term (current) use of oral hypoglycemic drugs: Secondary | ICD-10-CM | POA: Diagnosis not present

## 2024-07-23 DIAGNOSIS — Z794 Long term (current) use of insulin: Secondary | ICD-10-CM | POA: Diagnosis not present

## 2024-07-23 DIAGNOSIS — E119 Type 2 diabetes mellitus without complications: Secondary | ICD-10-CM | POA: Insufficient documentation

## 2024-07-23 DIAGNOSIS — R0981 Nasal congestion: Secondary | ICD-10-CM

## 2024-07-23 LAB — RESP PANEL BY RT-PCR (RSV, FLU A&B, COVID)  RVPGX2
Influenza A by PCR: NEGATIVE
Influenza B by PCR: NEGATIVE
Resp Syncytial Virus by PCR: NEGATIVE
SARS Coronavirus 2 by RT PCR: NEGATIVE

## 2024-07-23 NOTE — ED Triage Notes (Signed)
 Pt c/o congestion, fatigue x 2d

## 2024-07-24 NOTE — ED Provider Notes (Signed)
 [place] EMERGENCY DEPART[facility] AT [facility] Provider Note   CSN: [id number] Arrival date & time: 07/23/24  2118     Patient presents with: Nasal Congestion   Shawna Coleman is a 48 y.o. female.   The history is provided by the patient.  Patient with hypertension, diabetes, hyperlipidemia presents with congestion, cough for the past 2 days.  No chest pain or shortness of breath.  No vomiting.  No fevers.  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 Authorizing Provider  amLODipine  (NORVASC ) 2.5 MG tablet Take 1 tablet (2.5 mg total) by mouth daily. 12/31/22 01/30/23  Yolande Lamar BROCKS, MD  famotidine  (PEPCID ) 20 MG tablet Take 1 tablet (20 mg total) by mouth 2 (two) times daily. Take 30 minutes before breakfast and dinner 02/18/21 03/20/21  Cottie Donnice PARAS, MD  fluticasone  (FLONASE ) 50 MCG/ACT nasal spray Place 2 sprays into both nostrils daily for 5 days. 08/12/21 08/17/21  Dreama Longs, MD  glipiZIDE (GLUCOTROL XL) 10 MG 24 hr tablet Take by mouth.    [provider]  Insulin Aspart (NOVOLOG [place]) Inject into the skin.    [provider]  insulin lispro (HUMALOG) 100 UNIT/ML injection Inject into the skin 2 (two) times daily.    [provider]  lidocaine  (LIDODERM ) 5 % Place 1 patch onto the skin daily. Remove & Discard patch within 12 hours or as directed by MD 01/19/22   Redwine, Madison A, PA-C  metFORMIN (GLUCOPHAGE) 850 MG tablet Take by mouth.    [provider]  oxymetazoline  (AFRIN NASAL SPRAY) 0.05 % nasal spray Place 1 spray into both nostrils 2 (two) times daily. 11/15/17   Loetta Senior, MD  pravastatin (PRAVACHOL) 20 MG tablet Take by mouth.    [provider]  selenium  sulfide  (SELSUN ) 2.5 % shampoo Massage 5-10 mL into wet scalp. Leave on for 2-3 minutes, then rinse scalp and repeat.  Apply twice each week for the next 2-4 weeks. 05/26/17   Ward, Jaime Pilcher, PA-C    Allergies: Hydrocodone , Etodolac, Macrolides and ketolides, Meloxicam, and Oxycontin  [oxycodone  hcl]    Review of Systems  Constitutional:  Negative for fever.  Respiratory:  Positive for cough.     Updated Vital Signs BP (!) 175/75 (BP Location: Right Arm)   Pulse 89   Temp 98.8 F (37.1 C) (Oral)   Resp 14   Ht 1.651 m (5' 5)   Wt 77.1 kg   SpO2 99%   BMI 28.29 kg/m   Physical Exam CONSTITUTIONAL: Well developed/well nourished, no distress walking around the room HEAD: Normocephalic/atraumatic EYES: EOMI/PERRL ENMT: Mucous membranes moist, uvula midline, no erythema or exudates, no stridor, no drooling NECK: supple no meningeal signs CV: S1/S2 noted, no murmurs/rubs/gallops noted LUNGS: Lungs are clear to auscultation bilaterally, no apparent distress NEURO: Pt is awake/alert/appropriate, moves all extremitiesx4.  No facial droop.    (all labs ordered are listed, but only abnormal results are displayed) Labs Reviewed  RESP PANEL BY RT-PCR (RSV, FLU A&B, COVID)  [id number]    EKG: None  Radiology: No results found.   Procedures   Medications Ordered in the ED - No data to display  Medical Decision Making  This patient presents to the ED for concern of cough and congestion, this involves an extensive number of treatment options, and is a complaint that carries with it a high risk of complications and morbidity.  The differential diagnosis includes but is not limited to viral URI, influenza, COVID-19, strep pharyngitis, bacterial sinusitis, viral sinusitis  Comorbidities that complicate the patient evaluation: Patients presentation is complicated by their history of diabetes and hypertension  Lab Tests: I Ordered, and personally  interpreted labs.  The pertinent results include: Viral panel negative  Test Considered: No indication for chest x-ray as lungs are clear and no hypoxia   Complexity of problems addressed: Patients presentation is most consistent with  acute, uncomplicated illness  Disposition: After consideration of the diagnostic results and the patients response to treatment,  I feel that the patent would benefit from discharge  .    This patient is well-appearing.  She is in no acute distress, no hypoxia, no tachycardia, no tachypnea, no added lung exam Viral panel negative. No indication for antibiotics given her timeframe.  Discussed nasal rinses, and judicious use of OTC decongestants due to her hypertension    Final diagnoses:  Nasal congestion  Viral URI with cough    ED Discharge Orders     None          Midge Golas, MD 07/24/24 [phone]
